# Patient Record
Sex: Female | Born: 1955 | Race: White | Hispanic: No | Marital: Married | Smoking: Never smoker
Health system: Southern US, Community
[De-identification: ages and names within clinical notes are randomized; demographics above are authoritative.]

## PROBLEM LIST (undated history)

## (undated) DIAGNOSIS — Z9221 Personal history of antineoplastic chemotherapy: Secondary | ICD-10-CM

## (undated) DIAGNOSIS — B029 Zoster without complications: Secondary | ICD-10-CM

## (undated) DIAGNOSIS — Z923 Personal history of irradiation: Secondary | ICD-10-CM

## (undated) DIAGNOSIS — C50211 Malignant neoplasm of upper-inner quadrant of right female breast: Secondary | ICD-10-CM

## (undated) DIAGNOSIS — C50919 Malignant neoplasm of unspecified site of unspecified female breast: Secondary | ICD-10-CM

## (undated) DIAGNOSIS — L570 Actinic keratosis: Secondary | ICD-10-CM

## (undated) HISTORY — DX: Malignant neoplasm of upper-inner quadrant of right female breast: C50.211

## (undated) HISTORY — DX: Actinic keratosis: L57.0

## (undated) HISTORY — DX: Zoster without complications: B02.9

---
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
## 2009-05-16 HISTORY — PX: COLONOSCOPY: SHX174

## 2011-11-09 ENCOUNTER — Ambulatory Visit (INDEPENDENT_AMBULATORY_CARE_PROVIDER_SITE_OTHER): Payer: BC Managed Care – PPO | Admitting: Internal Medicine

## 2011-11-09 ENCOUNTER — Encounter: Payer: Self-pay | Admitting: Internal Medicine

## 2011-11-09 ENCOUNTER — Other Ambulatory Visit (HOSPITAL_COMMUNITY)
Admission: RE | Admit: 2011-11-09 | Discharge: 2011-11-09 | Disposition: A | Discharge status: Home / Self Care | Payer: BC Managed Care – PPO | Source: Ambulatory Visit | Attending: Internal Medicine | Admitting: Internal Medicine

## 2011-11-09 VITALS — BP 106/64 | HR 70 | Temp 97.9°F | Resp 14 | Ht 65.0 in | Wt 141.0 lb

## 2011-11-09 DIAGNOSIS — Z01419 Encounter for gynecological examination (general) (routine) without abnormal findings: Secondary | ICD-10-CM | POA: Insufficient documentation

## 2011-11-09 DIAGNOSIS — E559 Vitamin D deficiency, unspecified: Secondary | ICD-10-CM

## 2011-11-09 DIAGNOSIS — Z1239 Encounter for other screening for malignant neoplasm of breast: Secondary | ICD-10-CM

## 2011-11-09 DIAGNOSIS — Z124 Encounter for screening for malignant neoplasm of cervix: Secondary | ICD-10-CM

## 2011-11-09 DIAGNOSIS — Z13220 Encounter for screening for lipoid disorders: Secondary | ICD-10-CM

## 2011-11-09 DIAGNOSIS — R5383 Other fatigue: Secondary | ICD-10-CM

## 2011-11-09 DIAGNOSIS — R5381 Other malaise: Secondary | ICD-10-CM

## 2011-11-09 DIAGNOSIS — B373 Candidiasis of vulva and vagina: Secondary | ICD-10-CM

## 2011-11-09 DIAGNOSIS — N760 Acute vaginitis: Secondary | ICD-10-CM | POA: Insufficient documentation

## 2011-11-09 DIAGNOSIS — Z1151 Encounter for screening for human papillomavirus (HPV): Secondary | ICD-10-CM | POA: Insufficient documentation

## 2011-11-09 NOTE — Progress Notes (Signed)
Patient ID: Karen Washington, female   DOB: 22-Oct-1955, 56 y.o.   MRN: [id number]  . There is no problem list on file for this patient.   Subjective:  CC:   Chief Complaint  Patient presents with  . New Patient    hasn't been seen in 2 years    HPI:   Karen Washington is a 56 y.o. female who presents  no significant PMH, presents today for her annual exam.  She has no specific complaints.    She sleeps an average of 7 to 9 hours per night,  Wakes up feeling refreshed.  Eats a healthy balanced diet.  Drinks 1 or  2 glasses of wine or other cocktail per night. She exercises 4 to 5 days per week with a combination of aerobic, stretching and light weights.  She is independent of all ADLs.  Does not use a walker or other assistive device.  No trouble climbing stairs. No chronic diarrhea or constipation.  Last annual was 2011. Occasional sleep disturbance.  menopause over 3 years ago  History reviewed. No pertinent past medical history.  History reviewed. No pertinent past surgical history.  History reviewed. No pertinent family history.  History   Social History  . Marital Status: Married    Spouse Name: N/A    Number of Children: N/A  . Years of Education: N/A   Occupational History  . Not on file.   Social History Main Topics  . Smoking status: Never Smoker   . Smokeless tobacco: Never Used  . Alcohol Use: Yes  . Drug Use: No  . Sexually Active: Not on file   Other Topics Concern  . Not on file   Social History Narrative  . No narrative on file        No Known Allergies  Review of Systems:   The remainder of the review of systems was negative except those addressed in the HPI.   Objective:  BP 106/64  Pulse 70  Temp 97.9 F (36.6 C) (Oral)  Resp 14  Ht 5\' 5"  (1.651 m)  Wt 141 lb (63.957 kg)  BMI 23.46 kg/m2  SpO2 97%  BP 106/64  Pulse 70  Temp 97.9 F (36.6 C) (Oral)  Resp 14  Ht 5\' 5"  (1.651 m)  Wt 141 lb (63.957 kg)  BMI 23.46 kg/m2  SpO2  97%  General Appearance:    Alert, cooperative, no distress, appears stated age  Head:    Normocephalic, without obvious abnormality, atraumatic  Eyes:    PERRL, conjunctiva/corneas clear, EOM's intact, fundi    benign, both eyes  Ears:    Normal TM's and external ear canals, both ears  Nose:   Nares normal, septum midline, mucosa normal, no drainage    or sinus tenderness  Throat:   Lips, mucosa, and tongue normal; teeth and gums normal  Neck:   Supple, symmetrical, trachea midline, no adenopathy;    thyroid:  no enlargement/tenderness/nodules; no carotid   bruit or JVD  Back:     Symmetric, no curvature, ROM normal, no CVA tenderness  Lungs:     Clear to auscultation bilaterally, respirations unlabored  Chest Wall:    No tenderness or deformity   Heart:    Regular rate and rhythm, S1 and S2 normal, no murmur, rub   or gallop  Breast Exam:    No tenderness, masses, or nipple abnormality  Abdomen:     Soft, non-tender, bowel sounds active all four quadrants,    no  masses, no organomegaly  Genitalia:    Normal female without lesion, discharge or tenderness  Rectal:    Normal tone, normal prostate, no masses or tenderness;   guaiac negative stool  Extremities:   Extremities normal, atraumatic, no cyanosis or edema  Pulses:   2+ and symmetric all extremities  Skin:   Skin color, texture, turgor normal, no rashes or lesions  Lymph nodes:   Cervical, supraclavicular, and axillary nodes normal  Neurologic:   CNII-XII intact, normal strength, sensation and reflexes    throughout      Assessment and Plan:  No problem-specific assessment & plan notes found for this encounter.   Updated Medication List Outpatient Encounter Prescriptions as of 11/09/2011  Medication Sig Dispense Refill  . calcium carbonate (OS-CAL) 600 MG TABS Take 600 mg by mouth 2 (two) times daily with a meal.      . cholecalciferol (VITAMIN D) 1000 UNITS tablet Take 1,000 Units by mouth daily.      . Multiple  Vitamin (MULTIVITAMIN) tablet Take 1 tablet by mouth daily.         Orders Placed This Encounter  Procedures  . MM Digital Screening  . HM MAMMOGRAPHY  . HM PAP SMEAR  . Comprehensive metabolic panel  . CBC with Differential  . Lipid panel  . TSH  . Vitamin D 25 hydroxy  . HM COLONOSCOPY    Return in about 1 year (around 11/08/2012).

## 2011-11-10 ENCOUNTER — Other Ambulatory Visit (INDEPENDENT_AMBULATORY_CARE_PROVIDER_SITE_OTHER): Payer: BC Managed Care – PPO | Admitting: *Deleted

## 2011-11-10 DIAGNOSIS — Z124 Encounter for screening for malignant neoplasm of cervix: Secondary | ICD-10-CM

## 2011-11-10 DIAGNOSIS — R5383 Other fatigue: Secondary | ICD-10-CM

## 2011-11-10 DIAGNOSIS — E559 Vitamin D deficiency, unspecified: Secondary | ICD-10-CM

## 2011-11-10 DIAGNOSIS — B373 Candidiasis of vulva and vagina: Secondary | ICD-10-CM

## 2011-11-10 DIAGNOSIS — Z1239 Encounter for other screening for malignant neoplasm of breast: Secondary | ICD-10-CM

## 2011-11-10 DIAGNOSIS — Z13220 Encounter for screening for lipoid disorders: Secondary | ICD-10-CM

## 2011-11-10 LAB — LIPID PANEL
HDL: 87.4 mg/dL (ref >39.00)
Total CHOL/HDL Ratio: 2
Triglycerides: 46 mg/dL (ref 0.0–149.0)

## 2011-11-10 LAB — CBC WITH DIFFERENTIAL/PLATELET
Basophils Absolute: 0 10*3/uL (ref 0.0–0.1)
Basophils Relative: 0.3 % (ref 0.0–3.0)
Eosinophils Absolute: 0.1 10*3/uL (ref 0.0–0.7)
Lymphocytes Relative: 16 % (ref 12.0–46.0)
MCHC: 33 g/dL (ref 30.0–36.0)
MCV: 95.7 fl (ref 78.0–100.0)
Monocytes Absolute: 0.5 10*3/uL (ref 0.1–1.0)
Neutrophils Relative %: 75.3 % (ref 43.0–77.0)
RBC: 4.52 Mil/uL (ref 3.87–5.11)
RDW: 12.8 % (ref 11.5–14.6)

## 2011-11-10 LAB — COMPREHENSIVE METABOLIC PANEL
ALT: 17 U/L (ref 0–35)
AST: 20 U/L (ref 0–37)
Albumin: 4.2 g/dL (ref 3.5–5.2)
Alkaline Phosphatase: 54 U/L (ref 39–117)
Chloride: 102 mEq/L (ref 96–112)
Potassium: 4.6 mEq/L (ref 3.5–5.1)
Sodium: 139 mEq/L (ref 135–145)
Total Protein: 6.9 g/dL (ref 6.0–8.3)

## 2011-11-10 LAB — TSH: TSH: 1.5 u[IU]/mL (ref 0.35–5.50)

## 2011-11-10 NOTE — Addendum Note (Signed)
Addended by: Baldomero Lamy on: 11/10/2011 09:58 AM   Modules accepted: Orders

## 2011-11-13 ENCOUNTER — Encounter: Payer: Self-pay | Admitting: Internal Medicine

## 2011-12-21 ENCOUNTER — Encounter: Payer: Self-pay | Admitting: Internal Medicine

## 2012-01-12 ENCOUNTER — Encounter: Payer: Self-pay | Admitting: Internal Medicine

## 2012-05-16 DIAGNOSIS — B029 Zoster without complications: Secondary | ICD-10-CM

## 2012-05-16 DIAGNOSIS — C50919 Malignant neoplasm of unspecified site of unspecified female breast: Secondary | ICD-10-CM

## 2012-05-16 HISTORY — PX: PORTACATH PLACEMENT: SHX2246

## 2012-05-16 HISTORY — DX: Zoster without complications: B02.9

## 2012-05-16 HISTORY — DX: Malignant neoplasm of unspecified site of unspecified female breast: C50.919

## 2012-05-16 HISTORY — PX: AUGMENTATION MAMMAPLASTY: SUR837

## 2012-11-13 DIAGNOSIS — C50211 Malignant neoplasm of upper-inner quadrant of right female breast: Secondary | ICD-10-CM

## 2012-11-13 HISTORY — DX: Malignant neoplasm of upper-inner quadrant of right female breast: C50.211

## 2012-11-19 ENCOUNTER — Encounter: Payer: Self-pay | Admitting: Adult Health

## 2012-11-19 ENCOUNTER — Ambulatory Visit (INDEPENDENT_AMBULATORY_CARE_PROVIDER_SITE_OTHER): Payer: BC Managed Care – PPO | Admitting: Adult Health

## 2012-11-19 VITALS — BP 120/72 | HR 72 | Temp 98.6°F | Resp 12 | Wt 140.5 lb

## 2012-11-19 DIAGNOSIS — R51 Headache: Secondary | ICD-10-CM

## 2012-11-19 DIAGNOSIS — R519 Headache, unspecified: Secondary | ICD-10-CM | POA: Insufficient documentation

## 2012-11-19 DIAGNOSIS — N6310 Unspecified lump in the right breast, unspecified quadrant: Secondary | ICD-10-CM | POA: Insufficient documentation

## 2012-11-19 DIAGNOSIS — N63 Unspecified lump in unspecified breast: Secondary | ICD-10-CM

## 2012-11-19 MED ORDER — VALACYCLOVIR HCL 1 G PO TABS: 1000.0000 mg | ORAL_TABLET | Freq: Two times a day (BID) | ORAL | Status: DC

## 2012-11-19 NOTE — Assessment & Plan Note (Addendum)
Right side head pain. Erythema and a maculopapular rash on right side of head, behind the ear and down towards the right occipital area. Burning, pain to touch and general malaise reported. Suspect early shingles vs. temporal arteritis. Check cbc, ESR, CRP, LFTs. Start valacyclovir 1000 mg bid x 7 days. Offered pain medication; however, patient would like to try OTC meds such as ibuprofen.

## 2012-11-19 NOTE — Progress Notes (Signed)
  Subjective:    Patient ID: Karen Washington, female    DOB: 1955/05/23, 57 y.o.   MRN: [id number]  HPI  Pt presents with right side head pain, tender to touch, but not headache, some neck tightness, fatigue. She has felt mild malaise today. Denies jaw pain or change in vision. Patient first noticed symptoms on Saturday. She reports a shooting pain into her ear however she specifies that it is not an ear ache. Pain radiates from the parietal area of her head down around her ear to the occipital area.  Also patient presents with feeling a lump on her right breast. She reports that it is also tender to touch. She has been working out with a Systems analyst and first noticed lump a couple of days ago. Her mammogram is due this month.  Review of Systems  Constitutional: Positive for fatigue. Negative for fever and chills.  HENT: Positive for neck stiffness.   Eyes: Negative for photophobia, pain, redness and visual disturbance.  Respiratory: Negative.   Cardiovascular: Negative.   Skin: Negative.   Neurological:       Pain and burning on right side of head, behind the ear and towards the posterior part of head  Psychiatric/Behavioral: Negative.   All other systems reviewed and are negative.  Breast: right breast tenderness with small lump at 12 o'clock.   BP 120/72  Pulse 72  Temp(Src) 98.6 F (37 C) (Oral)  Resp 12  Wt 140 lb 8 oz (63.73 kg)  BMI 23.38 kg/m2  SpO2 98%    Objective:   Physical Exam  Constitutional: She is oriented to person, place, and time. She appears well-developed and well-nourished. No distress.  HENT:  Head: Normocephalic and atraumatic.  Right Ear: External ear normal.  Left Ear: External ear normal.  Mouth/Throat: No oropharyngeal exudate.  Eyes: Conjunctivae and EOM are normal. Pupils are equal, round, and reactive to light.  Neck: Normal range of motion.  Cardiovascular: Normal rate and regular rhythm.   Pulmonary/Chest: Effort normal. No respiratory  distress. She has no wheezes.  Genitourinary: There is breast tenderness.  Lymphadenopathy:    She has no cervical adenopathy.  Neurological: She is alert and oriented to person, place, and time. No cranial nerve deficit. Coordination normal.  Skin:  erythema and a maculopapular rash on the right parietal area of head, behind the right ear and down towards the right occipital area. The area is painful to touch.   Psychiatric: She has a normal mood and affect. Her behavior is normal. Judgment and thought content normal.  Breast: right breast with small nodule at 12 o'clock. Painful to touch.     Assessment & Plan:

## 2012-11-19 NOTE — Assessment & Plan Note (Signed)
Small right breast nodule at 12 o'clock. Send for diagnostic mammogram.

## 2012-11-20 ENCOUNTER — Telehealth: Payer: Self-pay | Admitting: Internal Medicine

## 2012-11-20 ENCOUNTER — Other Ambulatory Visit: Payer: Self-pay | Admitting: Adult Health

## 2012-11-20 DIAGNOSIS — N6310 Unspecified lump in the right breast, unspecified quadrant: Secondary | ICD-10-CM

## 2012-11-20 LAB — C-REACTIVE PROTEIN: CRP: <0.5 mg/dL (ref 0.5–20.0)

## 2012-11-20 LAB — CBC WITH DIFFERENTIAL/PLATELET
Basophils Absolute: 0 10*3/uL (ref 0.0–0.1)
Eosinophils Relative: 2.3 % (ref 0.0–5.0)
HCT: 43.8 % (ref 36.0–46.0)
Hemoglobin: 14.6 g/dL (ref 12.0–15.0)
Lymphs Abs: 1.3 10*3/uL (ref 0.7–4.0)
Monocytes Relative: 11.7 % (ref 3.0–12.0)
Neutro Abs: 3.8 10*3/uL (ref 1.4–7.7)
RDW: 12.8 % (ref 11.5–14.6)

## 2012-11-20 LAB — HEPATIC FUNCTION PANEL
ALT: 16 U/L (ref 0–35)
AST: 19 U/L (ref 0–37)
Albumin: 4.2 g/dL (ref 3.5–5.2)
Total Protein: 6.8 g/dL (ref 6.0–8.3)

## 2012-11-20 LAB — SEDIMENTATION RATE: Sed Rate: 9 mm/hr (ref 0–22)

## 2012-11-20 NOTE — Telephone Encounter (Signed)
Your patient 

## 2012-11-20 NOTE — Telephone Encounter (Signed)
Patient needing a referral to Dr. Sheliah Hatch for a needle biopsy on her right breast.

## 2012-11-21 NOTE — Telephone Encounter (Signed)
Pt notified referral in progress.

## 2012-11-22 ENCOUNTER — Encounter: Payer: Self-pay | Admitting: General Surgery

## 2012-11-22 ENCOUNTER — Ambulatory Visit (INDEPENDENT_AMBULATORY_CARE_PROVIDER_SITE_OTHER): Payer: BC Managed Care – PPO | Admitting: General Surgery

## 2012-11-22 VITALS — BP 116/78 | HR 78 | Resp 14 | Ht 65.0 in

## 2012-11-22 DIAGNOSIS — C50211 Malignant neoplasm of upper-inner quadrant of right female breast: Secondary | ICD-10-CM | POA: Insufficient documentation

## 2012-11-22 DIAGNOSIS — N63 Unspecified lump in unspecified breast: Secondary | ICD-10-CM | POA: Insufficient documentation

## 2012-11-22 DIAGNOSIS — Z853 Personal history of malignant neoplasm of breast: Secondary | ICD-10-CM | POA: Insufficient documentation

## 2012-11-22 HISTORY — PX: BREAST BIOPSY: SHX20

## 2012-11-22 NOTE — Progress Notes (Signed)
Patient ID: Karen Washington, female   DOB: 22-Sep-1955, 57 y.o.   MRN: [id number]  Chief Complaint  Patient presents with  . Breast Problem    category 4 mammogram    HPI Karen Washington is a 57 y.o. female who presents for a breast evaluation. The most recent mammogram was done on 11/20/12 at [facility] with a birad category 4. The patient states she noticed a knot approximately 2-3 weeks ago in the right upper inner quadrant of the right breast. She does have tenderness in that area. She does self breast check every other month and does get regular mammograms done.  The patient had had a strenuous week with the personal trainer, swam for exercise for the first time in one year and then was doing aerobic workout when she noticed soreness in both chest wall muscles. The left chest wall discomfort resolved, and palpation of the right side she became aware of a lump about 3 fingerbreadths below the clavicle.  HPI  Past Medical History  Diagnosis Date  . Shingles 2014    History reviewed. No pertinent past surgical history.  Family History  Problem Relation Age of Onset  . Cancer Mother     uterine   . Cancer Maternal Grandmother     breast    Social History History  Substance Use Topics  . Smoking status: Never Smoker   . Smokeless tobacco: Never Used  . Alcohol Use: Yes    No Known Allergies  Current Outpatient Prescriptions  Medication Sig Dispense Refill  . calcium carbonate (OS-CAL) 600 MG TABS Take 600 mg by mouth 2 (two) times daily with a meal.      . cholecalciferol (VITAMIN D) 1000 UNITS tablet Take 1,000 Units by mouth daily.      Marland Kitchen ibuprofen (ADVIL,MOTRIN) 200 MG tablet Take 200 mg by mouth every 6 (six) hours as needed for pain.      . Multiple Vitamin (MULTIVITAMIN) tablet Take 1 tablet by mouth daily.      . valACYclovir (VALTREX) 1000 MG tablet Take 1 tablet (1,000 mg total) by mouth 2 (two) times daily.  14 tablet  0   No current facility-administered medications for this  visit.    Review of Systems Review of Systems  Blood pressure 116/78, pulse 78, resp. rate 14, height 5\' 5"  (1.651 m).  Physical Exam Physical Exam  Constitutional: She is oriented to person, place, and time. She appears well-developed and well-nourished.  Neck: No thyromegaly present.  Cardiovascular: Normal rate, regular rhythm and normal heart sounds.   No murmur heard. Pulmonary/Chest: Effort normal and breath sounds normal. Right breast exhibits no inverted nipple, no mass, no nipple discharge, no skin change and no tenderness. Left breast exhibits no inverted nipple, no mass, no nipple discharge, no skin change and no tenderness. Breasts are symmetrical.  Little thickening in the left upper outer quadrant of left breast.   Thickening in the right upper outer quadrant of right breast.   1 o'clock thickening 12 cm from the nipple 2 cm area of right breast.   Lymphadenopathy:    She has no cervical adenopathy.  Neurological: She is alert and oriented to person, place, and time.  Skin: Skin is warm and dry.    Data Reviewed October 21, 2012 mammograms and ultrasound of the right breast completed at [facility] were reviewed. Ultrasound shows a ill-defined bilobed mass in the upper quadrant.  Ultrasound examination of the right breast 12 cm from the nipple at  the 1:00 position identified a multilobed hypoechoic mass extending from the pectoralis fashion to within 5 mm of the skin. The areas measured 0.53 x 0.57 x 0.85 and 0.68 x 0.71 x 0.80 but in aggregate measured 1.28 x 1.80 transversely. No significant flow was identified a vascular imaging. The patient was amenable to biopsy.  10 cc of 0.5% Xylocaine with 0.25% Marcaine with one 200,000 of epinephrine was utilized well-tolerated. A 10-gauge Encor device was advanced under ultrasound guidance and a significant portion of both lobes of the mass were removed. Some discomfort was appreciated on the superficial bites. A postbiopsy clip  was placed. Initially there was a hematoma cavity developing. This was rinsed with 10 cc of 1% plain Xylocaine with one 100,000 of epinephrine and direct pressure held for 10 minutes. Repeat ultrasound showed no hematoma and the skin defect was closed with benzoin and Steri-Strips followed by Telfa and Tegaderm dressing.  Assessment    Right breast mass, no evidence of underlying muscle hematoma.     Plan    Instructions were provided for postoperative wound care. She'll be contacted with pathology is available.        Earline Mayotte 11/23/2012, 6:39 PM

## 2012-11-22 NOTE — Patient Instructions (Addendum)

## 2012-11-23 ENCOUNTER — Encounter: Payer: Self-pay | Admitting: General Surgery

## 2012-11-23 ENCOUNTER — Telehealth: Payer: Self-pay | Admitting: General Surgery

## 2012-11-23 DIAGNOSIS — C50211 Malignant neoplasm of upper-inner quadrant of right female breast: Secondary | ICD-10-CM

## 2012-11-23 NOTE — Telephone Encounter (Signed)
The patient was notified that the right breast biopsy completed November 22, 2012 showed evidence of invasive cancer.  Arrangements are placed for sitdown review on Monday, November 26, 2012 at 8 AM.

## 2012-11-26 ENCOUNTER — Ambulatory Visit (INDEPENDENT_AMBULATORY_CARE_PROVIDER_SITE_OTHER): Payer: BC Managed Care – PPO | Admitting: General Surgery

## 2012-11-26 ENCOUNTER — Ambulatory Visit: Payer: Self-pay | Admitting: General Surgery

## 2012-11-26 ENCOUNTER — Encounter: Payer: Self-pay | Admitting: General Surgery

## 2012-11-26 VITALS — BP 130/78 | HR 84 | Resp 14 | Ht 65.0 in | Wt 137.0 lb

## 2012-11-26 DIAGNOSIS — C50919 Malignant neoplasm of unspecified site of unspecified female breast: Secondary | ICD-10-CM

## 2012-11-26 NOTE — Patient Instructions (Signed)
Patient to have labs and a chest x-ray. 

## 2012-11-26 NOTE — Progress Notes (Signed)
Patient ID: Karen Washington, female   DOB: Jul 19, 1955, 57 y.o.   MRN: [id number]   The patient is seen this morning accompanied by her husband to review the results were recently completed right breast biopsy. This showed evidence of invasive mammary cancer. She been notified of the results by phone on weekend. She reports minimal discomfort at the biopsy site.  Examination of the right pressures minimal bruising. No hematoma formation.  We spent the majority of our 30-40 minutes reviewing options for management. Mastectomy and lumpectomy/radiation therapy were present it is equivalent procedures. Indication for sentinel node biopsy was discussed. Opportunity for second opinion locally or at any of the local universities was reviewed.  An informational brochure was provided. The patient will contact the office if she has any questions, and will be kept up-to-date as her laboratory results become available.  Patient to have the following labs drawn: Met B, CEA, and CA 27-29. This patient will also have a chest x-ray PA and lateral at [facility].

## 2012-11-27 ENCOUNTER — Encounter: Payer: Self-pay | Admitting: General Surgery

## 2012-11-28 LAB — BASIC METABOLIC PANEL
BUN/Creatinine Ratio: 17 (ref 9–23)
CO2: 26 mmol/L (ref 18–29)
Chloride: 99 mmol/L (ref 97–108)
GFR calc Af Amer: 73 mL/min/{1.73_m2} (ref >59)
Sodium: 141 mmol/L (ref 134–144)

## 2012-11-28 LAB — CANCER ANTIGEN 27.29: CA 27.29: 18.2 U/mL (ref 0.0–38.6)

## 2012-11-29 ENCOUNTER — Telehealth: Payer: Self-pay | Admitting: *Deleted

## 2012-11-29 ENCOUNTER — Ambulatory Visit: Payer: BC Managed Care – PPO

## 2012-11-29 MED ORDER — VALACYCLOVIR HCL 1 G PO TABS: 1000.0000 mg | ORAL_TABLET | Freq: Two times a day (BID) | ORAL | Status: DC

## 2012-11-29 NOTE — Telephone Encounter (Signed)
Saw Karen Rey NP last Monday Diagnosed with shingles finished ABX and one day off ABX and symptoms returned by today, patient would like to know if she needs more meds for shingles or another appointment , please advise?

## 2012-11-29 NOTE — Telephone Encounter (Signed)
Pt called again, wanting to know if she could take more Valacyclovir. Completed the 7 days and states recurrence of blisters, stated pain is tolerable. Dr. Darrick Huntsman notified and advised to refill Valacyclovir and for pt to be seen in office tomorrow to evaluate. Pt verbalized understanding, appt scheduled for tomorrow and Rx sent to pharmacy by escript.

## 2012-11-30 ENCOUNTER — Ambulatory Visit (INDEPENDENT_AMBULATORY_CARE_PROVIDER_SITE_OTHER): Payer: BC Managed Care – PPO | Admitting: Internal Medicine

## 2012-11-30 ENCOUNTER — Encounter: Payer: Self-pay | Admitting: Internal Medicine

## 2012-11-30 VITALS — BP 110/88 | HR 74 | Temp 98.2°F | Resp 14 | Wt 136.8 lb

## 2012-11-30 DIAGNOSIS — N63 Unspecified lump in unspecified breast: Secondary | ICD-10-CM

## 2012-11-30 DIAGNOSIS — N6310 Unspecified lump in the right breast, unspecified quadrant: Secondary | ICD-10-CM

## 2012-11-30 DIAGNOSIS — B029 Zoster without complications: Secondary | ICD-10-CM

## 2012-11-30 LAB — PATHOLOGY

## 2012-11-30 MED ORDER — SULFAMETHOXAZOLE-TRIMETHOPRIM 800-160 MG PO TABS: 1.0000 | ORAL_TABLET | Freq: Two times a day (BID) | ORAL | Status: DC

## 2012-11-30 MED ORDER — ALPRAZOLAM 0.25 MG PO TABS: 0.2500 mg | ORAL_TABLET | Freq: Two times a day (BID) | ORAL | Status: DC | PRN

## 2012-11-30 NOTE — Patient Instructions (Addendum)
Start the antibiotic (Septra) if you develop sin irritation and redness around the blisters  The alprazolam is very mild and can be taken at 2 am for insomnia

## 2012-11-30 NOTE — Assessment & Plan Note (Signed)
Lumpectomy planned for July 31

## 2012-12-02 ENCOUNTER — Encounter: Payer: Self-pay | Admitting: Internal Medicine

## 2012-12-02 DIAGNOSIS — B029 Zoster without complications: Secondary | ICD-10-CM | POA: Insufficient documentation

## 2012-12-02 NOTE — Assessment & Plan Note (Signed)
Recurrent on right side of scalp.  Recurrence is likely, secondary to severe emotional stress.  Will repeat one week of acyclovir.  I have given her an rx for Septra DS given that she will be out of town in the event that she fevelops secondary bacterial infection

## 2012-12-02 NOTE — Progress Notes (Signed)
Patient ID: Karen Washington, female   DOB: 1956/04/22, 57 y.o.   MRN: [id number]   Patient Active Problem List   Diagnosis Date Noted  . Shingles outbreak 12/02/2012  . Lump or mass in breast 11/22/2012  . Breast cancer of upper-inner quadrant of right female breast 11/22/2012  . Head pain 11/19/2012  . Lump of right breast 11/19/2012    Subjective:  CC:   Chief Complaint  Patient presents with  . Acute Visit    shingles    HPI:   Karen Washington a 57 y.o. female who presents Recurrent blistering lesions on occipital scalp. Patient was treated for shingles on July 7 by Raquel when she presented with a painful maculopapular rash from the parietal to occipital  Scalp.  After a week of acyclovir she noted resolution of lesions.  However, she noted recurrence of lesions two days ago.  She has been under considerable stress since the discovery of a breast mass self discovered which was biopsied on July 10th and positive for invasive CA .   She is scheduled for lumpectomy next week and is anxious to get it over with. She is gong to the beach for a week to try to relax prior to the procedure.  No fevers, headaches,  Vision changes.     Past Medical History  Diagnosis Date  . Shingles 2014    History reviewed. No pertinent past surgical history.     The following portions of the patient's history were reviewed and updated as appropriate: Allergies, current medications, and problem list.    Review of Systems:   12 Pt  review of systems was negative except those addressed in the HPI,     History   Social History  . Marital Status: Married    Spouse Name: N/A    Number of Children: N/A  . Years of Education: N/A   Occupational History  . Not on file.   Social History Main Topics  . Smoking status: Never Smoker   . Smokeless tobacco: Never Used  . Alcohol Use: Yes  . Drug Use: No  . Sexually Active: Not on file   Other Topics Concern  . Not on file   Social History  Narrative  . No narrative on file    Objective:  BP 110/88  Pulse 74  Temp(Src) 98.2 F (36.8 C) (Oral)  Resp 14  Wt 136 lb 12 oz (62.029 kg)  BMI 22.76 kg/m2  SpO2 99%  General appearance: alert, cooperative and appears stated age Ears: normal TM's and external ear canals both ears Throat: lips, mucosa, and tongue normal; teeth and gums normal Neck: no adenopathy, no carotid bruit, supple, symmetrical, trachea midline and thyroid not enlarged, symmetric, no tenderness/mass/nodules Back: symmetric, no curvature. ROM normal. No CVA tenderness. Lungs: clear to auscultation bilaterally Heart: regular rate and rhythm, S1, S2 normal, no murmur, click, rub or gallop Abdomen: soft, non-tender; bowel sounds normal; no masses,  no organomegaly Pulses: 2+ and symmetric Skin: papular rash on posterior neck. Healing lesions on scalp. Lymph nodes: Cervical, supraclavicular, and axillary nodes normal.  Assessment and Plan:  Lump of right breast Lumpectomy planned for July 31  Shingles outbreak Recurrent on right side of scalp.  Recurrence is likely, secondary to severe emotional stress.  Will repeat one week of acyclovir.  I have given her an rx for Septra DS given that she will be out of town in the event that she fevelops secondary bacterial infection  Updated Medication List Outpatient Encounter Prescriptions as of 11/30/2012  Medication Sig Dispense Refill  . calcium carbonate (OS-CAL) 600 MG TABS Take 600 mg by mouth 2 (two) times daily with a meal.      . cholecalciferol (VITAMIN D) 1000 UNITS tablet Take 1,000 Units by mouth daily.      . Multiple Vitamin (MULTIVITAMIN) tablet Take 1 tablet by mouth daily.      . valACYclovir (VALTREX) 1000 MG tablet Take 1 tablet (1,000 mg total) by mouth 2 (two) times daily.  14 tablet  0  . ALPRAZolam (XANAX) 0.25 MG tablet Take 1 tablet (0.25 mg total) by mouth 2 (two) times daily as needed for sleep or anxiety.  20 tablet  0  . ibuprofen  (ADVIL,MOTRIN) 200 MG tablet Take 200 mg by mouth every 6 (six) hours as needed for pain.      Marland Kitchen sulfamethoxazole-trimethoprim (SEPTRA DS) 800-160 MG per tablet Take 1 tablet by mouth 2 (two) times daily.  14 tablet  0   No facility-administered encounter medications on file as of 11/30/2012.     No orders of the defined types were placed in this encounter.    No Follow-up on file.

## 2012-12-04 ENCOUNTER — Telehealth: Payer: Self-pay | Admitting: General Surgery

## 2012-12-04 ENCOUNTER — Telehealth: Payer: Self-pay | Admitting: *Deleted

## 2012-12-04 NOTE — Telephone Encounter (Signed)
Cancelled call

## 2012-12-04 NOTE — Telephone Encounter (Signed)
Message copied by Nicholes Mango on Tue Dec 04, 2012  3:16 PM ------      Message from: Randleman, Merrily Pew      Created: Tue Dec 04, 2012  2:17 PM       Proceed with planned dates for surgery.      ----- Message -----         From: Wendall Stade, CMA         Sent: 11/29/2012   1:36 PM           To: Earline Mayotte, MD            Since patient has the shingles will we have to delay surgery? We are looking at doing surgery 12-13-12. Patient started having symptoms on 11-16-12 and was diagnosed on 11-19-12. She was on medication for this for one week and finished on 11-26-12. Patient states symptoms started coming back once she finished meds and has a call into Dr. Darrick Huntsman. Please advise. Thanks.       ------

## 2012-12-04 NOTE — Telephone Encounter (Signed)
Patient has been scheduled for surgery on 12-13-12 at [facility]. Paperwork has been mailed to the patient. She will call if she has further questions.

## 2012-12-05 ENCOUNTER — Other Ambulatory Visit: Payer: Self-pay | Admitting: General Surgery

## 2012-12-05 DIAGNOSIS — C50211 Malignant neoplasm of upper-inner quadrant of right female breast: Secondary | ICD-10-CM

## 2012-12-07 ENCOUNTER — Telehealth: Payer: Self-pay | Admitting: *Deleted

## 2012-12-07 ENCOUNTER — Encounter: Payer: Self-pay | Admitting: General Surgery

## 2012-12-07 NOTE — Telephone Encounter (Signed)
Patient was contacted today to let her know that the arrival time to be at [facility] the day of surgery has been changed from 7:30 am to 10:15 am. She verbalizes understanding.

## 2012-12-13 ENCOUNTER — Ambulatory Visit: Payer: Self-pay | Admitting: General Surgery

## 2012-12-13 DIAGNOSIS — C50219 Malignant neoplasm of upper-inner quadrant of unspecified female breast: Secondary | ICD-10-CM

## 2012-12-13 HISTORY — PX: BREAST SURGERY: SHX581

## 2012-12-13 HISTORY — PX: BREAST LUMPECTOMY: SHX2

## 2012-12-14 ENCOUNTER — Encounter: Payer: Self-pay | Admitting: General Surgery

## 2012-12-14 LAB — PATHOLOGY REPORT

## 2012-12-15 ENCOUNTER — Telehealth: Payer: Self-pay | Admitting: General Surgery

## 2012-12-15 NOTE — Telephone Encounter (Signed)
Patient contacted with pathology results. Margins clear. Sentinel nodes x4 negative.  ER/PR-negative tumor. HER-2/neu was amplified.  This puts her at slightly higher risk for recurrent disease and formal medical oncology evaluation will be appropriate.  She reports that the Ace wrap bandage has been sliding and she'll discontinue this and replaced with a sports bra. She may initiate showers tomorrow. Followup the week of August 4 as previously scheduled.

## 2012-12-17 ENCOUNTER — Encounter: Payer: Self-pay | Admitting: General Surgery

## 2012-12-17 ENCOUNTER — Ambulatory Visit (INDEPENDENT_AMBULATORY_CARE_PROVIDER_SITE_OTHER): Payer: BC Managed Care – PPO | Admitting: General Surgery

## 2012-12-17 VITALS — BP 110/70 | HR 72 | Resp 12 | Ht 65.0 in | Wt 139.0 lb

## 2012-12-17 DIAGNOSIS — C50211 Malignant neoplasm of upper-inner quadrant of right female breast: Secondary | ICD-10-CM

## 2012-12-17 DIAGNOSIS — C50219 Malignant neoplasm of upper-inner quadrant of unspecified female breast: Secondary | ICD-10-CM

## 2012-12-17 NOTE — Patient Instructions (Addendum)
Patient to wear bra day and night.   This patient has been scheduled for an appointment with Dr. Sherrlyn Hock and Dr. Rushie Chestnut for 12-18-12 at 9 am. She is aware of date, time, and instructions.

## 2012-12-17 NOTE — Progress Notes (Signed)
Patient ID: Karen Washington, female   DOB: 1955-07-28, 57 y.o.   MRN: [id number]  Chief Complaint  Patient presents with  . Routine Post Op    right lumpectomy    HPI Karen Washington is a 57 y.o. female here today for her post op right lumpectomy done on 12/13/12. Patient states she is doing well and not having any pain. The patient had been contacted on August 2 regarding her pathology results. She is accompanied today by her husband was present for the interview and exam. HPI  Past Medical History  Diagnosis Date  . Shingles 2014    Past Surgical History  Procedure Laterality Date  . Breast surgery Right 2014    lumpectomy    Family History  Problem Relation Age of Onset  . Cancer Mother     uterine   . Cancer Maternal Grandmother     breast    Social History History  Substance Use Topics  . Smoking status: Never Smoker   . Smokeless tobacco: Never Used  . Alcohol Use: Yes    No Known Allergies  Current Outpatient Prescriptions  Medication Sig Dispense Refill  . ALPRAZolam (XANAX) 0.25 MG tablet Take 1 tablet (0.25 mg total) by mouth 2 (two) times daily as needed for sleep or anxiety.  20 tablet  0  . calcium carbonate (OS-CAL) 600 MG TABS Take 600 mg by mouth 2 (two) times daily with a meal.      . cholecalciferol (VITAMIN D) 1000 UNITS tablet Take 1,000 Units by mouth daily.      Marland Kitchen HYDROcodone-acetaminophen (NORCO/VICODIN) 5-325 MG per tablet Take 1 tablet by mouth as needed.      Marland Kitchen ibuprofen (ADVIL,MOTRIN) 200 MG tablet Take 200 mg by mouth every 6 (six) hours as needed for pain.      . Multiple Vitamin (MULTIVITAMIN) tablet Take 1 tablet by mouth daily.      Marland Kitchen sulfamethoxazole-trimethoprim (SEPTRA DS) 800-160 MG per tablet Take 1 tablet by mouth 2 (two) times daily.  14 tablet  0  . valACYclovir (VALTREX) 1000 MG tablet Take 1 tablet (1,000 mg total) by mouth 2 (two) times daily.  14 tablet  0   No current facility-administered medications for this visit.    Review  of Systems Review of Systems  Constitutional: Negative.   Respiratory: Negative.   Cardiovascular: Negative.     Blood pressure 110/70, pulse 72, resp. rate 12, height 5\' 5"  (1.651 m), weight 139 lb (63.05 kg).  Physical Exam Physical Exam  Constitutional: She is oriented to person, place, and time. She appears well-developed and well-nourished.  Pulmonary/Chest:  Right lumpectomy site healing well.  The right breast is situated in a normal position with good alignment of the nipple/areolar complex. Moderate ptosis of the left breast appreciated.  The shoulder range of motion.  Neurological: She is oriented to person, place, and time.  Skin: Skin is warm and dry.    Data Reviewed Pathology results reviewed. 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 Adjuvant online program suggested 12% absolute benefit from chemotherapy. No benefit from antiestrogen therapy.  Assessment    Doing well status post wide excision mastoplasty.    Plan    The patient is amenable to evaluation with medical and radiation oncology.    This patient has been scheduled for an appointment with Dr. Sherrlyn Washington and Dr. Rushie Washington for 12-18-12 at 9 am. She is aware of date, time, and instructions.  Karen Washington 12/17/2012, 7:14 PM

## 2012-12-18 ENCOUNTER — Ambulatory Visit: Payer: Self-pay | Admitting: Internal Medicine

## 2012-12-18 ENCOUNTER — Telehealth: Payer: Self-pay | Admitting: *Deleted

## 2012-12-18 ENCOUNTER — Other Ambulatory Visit: Payer: Self-pay | Admitting: General Surgery

## 2012-12-18 DIAGNOSIS — C50211 Malignant neoplasm of upper-inner quadrant of right female breast: Secondary | ICD-10-CM

## 2012-12-18 NOTE — Telephone Encounter (Signed)
Patient's surgery has been scheduled for 01-02-13 at [facility]. We will review instructions with patient at pre-op visit.   Tia at the [facility] was also informed of surgery plans.

## 2012-12-18 NOTE — Telephone Encounter (Signed)
Message copied by Nicholes Mango on Tue Dec 18, 2012  2:06 PM ------      Message from: Earline Mayotte      Created: Tue Dec 18, 2012 12:36 PM       Schedule for the week of Aug 18th.  Patient should be aware of program. Will review at OV on Aug 13th.       ----- Message -----         From: Wendall Stade, CMA         Sent: 12/18/2012  10:31 AM           To: Earline Mayotte, MD            Do you need to talk to patient re: port placement or can I go ahead and arrange surgery? See info on your desk. Thanks.       ------

## 2012-12-25 LAB — HER-2 / NEU, FISH: Avg Num CEP17 probes/nucleus:: 1.8

## 2012-12-25 LAB — ER/PR,IMMUNOHISTOCHEM,PARAFFIN
Estrogen Receptor IHC: 0 %
Progesterone Recp IP: 0 %

## 2012-12-26 ENCOUNTER — Encounter: Payer: Self-pay | Admitting: General Surgery

## 2012-12-26 ENCOUNTER — Ambulatory Visit (INDEPENDENT_AMBULATORY_CARE_PROVIDER_SITE_OTHER): Payer: BC Managed Care – PPO | Admitting: General Surgery

## 2012-12-26 VITALS — BP 108/72 | HR 72 | Resp 12 | Ht 65.0 in | Wt 134.0 lb

## 2012-12-26 DIAGNOSIS — C50211 Malignant neoplasm of upper-inner quadrant of right female breast: Secondary | ICD-10-CM

## 2012-12-26 DIAGNOSIS — C50219 Malignant neoplasm of upper-inner quadrant of unspecified female breast: Secondary | ICD-10-CM

## 2012-12-26 NOTE — Patient Instructions (Addendum)
Continue self breast exams. Call office for any new breast issues or concerns. Port placement scheduled Discussed resuming activities gradually.   Discussed port placement risk and benefits. May use scar fade or mederma as desired

## 2012-12-26 NOTE — Progress Notes (Signed)
Patient ID: Karen Washington, female   DOB: 03/16/1956, 57 y.o.   MRN: [id number]  Chief Complaint  Patient presents with  . Follow-up    HPI Karen Washington is a 57 y.o. female.  Patient here today for follow up right breast lumpectomy December 13 2012.  States she is doing well.  Starts chemotherapy Sept 2 2014.  Power port placement scheduled for 01/02/2013. HPI  Past Medical History  Diagnosis Date  . Shingles 2014    Past Surgical History  Procedure Laterality Date  . Breast surgery Right December 13 2012    lumpectomy    Family History  Problem Relation Age of Onset  . Cancer Mother     uterine   . Cancer Maternal Grandmother     breast    Social History History  Substance Use Topics  . Smoking status: Never Smoker   . Smokeless tobacco: Never Used  . Alcohol Use: Yes    No Known Allergies  Current Outpatient Prescriptions  Medication Sig Dispense Refill  . ALPRAZolam (XANAX) 0.25 MG tablet Take 1 tablet (0.25 mg total) by mouth 2 (two) times daily as needed for sleep or anxiety.  20 tablet  0  . calcium carbonate (OS-CAL) 600 MG TABS Take 600 mg by mouth 2 (two) times daily with a meal.      . cholecalciferol (VITAMIN D) 1000 UNITS tablet Take 1,000 Units by mouth daily.      Marland Kitchen ibuprofen (ADVIL,MOTRIN) 200 MG tablet Take 200 mg by mouth every 6 (six) hours as needed for pain.      . Multiple Vitamin (MULTIVITAMIN) tablet Take 1 tablet by mouth daily.       No current facility-administered medications for this visit.    Review of Systems Review of Systems  Constitutional: Negative.   Respiratory: Negative.   Cardiovascular: Negative.     Blood pressure 108/72, pulse 72, resp. rate 12, height 5\' 5"  (1.651 m), weight 134 lb (60.782 kg).  Physical Exam Physical Exam  Constitutional: She is oriented to person, place, and time. She appears well-developed and well-nourished.  Cardiovascular: Normal rate and regular rhythm.   Pulmonary/Chest: Effort normal and breath  sounds normal.  Neurological: She is alert and oriented to person, place, and time.  Skin: Skin is warm and dry.   The right breast volume is well preserved and the nipple areolar complex was well centered. There is moderate ptosis involving the left breast. Incision well healed right breast lumpectomy site.  Data Reviewed No data.  Assessment    Right breast cancer, doing well status post wide excision, mastoplasty and sentinel node biopsy.    Plan    The indication for power port placement as well as risks were reviewed.   The patient raised questionable in her left breast could be corrected to match the right. I recommended that this be postponed until one year after radiation therapy to minimize significant size disparity.       Earline Mayotte 12/26/2012, 1:17 PM

## 2013-01-02 ENCOUNTER — Ambulatory Visit: Payer: Self-pay | Admitting: General Surgery

## 2013-01-02 DIAGNOSIS — C50219 Malignant neoplasm of upper-inner quadrant of unspecified female breast: Secondary | ICD-10-CM

## 2013-01-04 ENCOUNTER — Encounter: Payer: Self-pay | Admitting: General Surgery

## 2013-01-14 ENCOUNTER — Ambulatory Visit: Payer: Self-pay | Admitting: Internal Medicine

## 2013-01-15 LAB — COMPREHENSIVE METABOLIC PANEL
Albumin: 4.3 g/dL (ref 3.4–5.0)
Calcium, Total: 9.5 mg/dL (ref 8.5–10.1)
Chloride: 104 mmol/L (ref 98–107)
Co2: 28 mmol/L (ref 21–32)
Creatinine: 1.08 mg/dL (ref 0.60–1.30)
EGFR (Non-African Amer.): 58 — ABNORMAL LOW
Glucose: 130 mg/dL — ABNORMAL HIGH (ref 65–99)
Osmolality: 280 (ref 275–301)
Potassium: 4 mmol/L (ref 3.5–5.1)
SGPT (ALT): 27 U/L (ref 12–78)
Total Protein: 7.7 g/dL (ref 6.4–8.2)

## 2013-01-15 LAB — CBC CANCER CENTER
Basophil %: 0.2 %
Eosinophil #: 0 x10 3/mm (ref 0.0–0.7)
Eosinophil %: 0 %
HCT: 42 % (ref 35.0–47.0)
Lymphocyte #: 1.2 x10 3/mm (ref 1.0–3.6)
Lymphocyte %: 9.5 %
MCH: 31.9 pg (ref 26.0–34.0)
MCHC: 34.1 g/dL (ref 32.0–36.0)
Monocyte %: 6.7 %
Neutrophil #: 10.4 x10 3/mm — ABNORMAL HIGH (ref 1.4–6.5)
Neutrophil %: 83.6 %
Platelet: 253 x10 3/mm (ref 150–440)
RDW: 13.1 % (ref 11.5–14.5)
WBC: 12.4 x10 3/mm — ABNORMAL HIGH (ref 3.6–11.0)

## 2013-01-16 LAB — CANCER ANTIGEN 27.29: CA 27.29: 9.9 U/mL (ref 0.0–38.6)

## 2013-01-22 LAB — CBC CANCER CENTER
Bands: 20 %
Eosinophil: 1 %
HCT: 42.7 % (ref 35.0–47.0)
MCH: 31.7 pg (ref 26.0–34.0)
MCV: 94 fL (ref 80–100)
Monocytes: 17 %
Myelocyte: 1 %
Platelet: 173 x10 3/mm (ref 150–440)
Promyelocyte: 1 %
RDW: 12.8 % (ref 11.5–14.5)
Segmented Neutrophils: 34 %
WBC: 16.9 x10 3/mm — ABNORMAL HIGH (ref 3.6–11.0)

## 2013-01-28 LAB — CBC CANCER CENTER
Basophil #: 0.2 x10 3/mm — ABNORMAL HIGH (ref 0.0–0.1)
Basophil %: 1 %
Eosinophil %: 0.1 %
HCT: 38.9 % (ref 35.0–47.0)
Lymphocyte #: 2.2 x10 3/mm (ref 1.0–3.6)
MCH: 32 pg (ref 26.0–34.0)
MCHC: 34 g/dL (ref 32.0–36.0)
Monocyte %: 5.7 %
Neutrophil %: 79.2 %
Platelet: 184 x10 3/mm (ref 150–440)
RDW: 13.2 % (ref 11.5–14.5)
WBC: 16 x10 3/mm — ABNORMAL HIGH (ref 3.6–11.0)

## 2013-02-03 DIAGNOSIS — Z9221 Personal history of antineoplastic chemotherapy: Secondary | ICD-10-CM

## 2013-02-03 HISTORY — DX: Personal history of antineoplastic chemotherapy: Z92.21

## 2013-02-04 ENCOUNTER — Encounter: Payer: BC Managed Care – PPO | Admitting: Internal Medicine

## 2013-02-04 LAB — HEPATIC FUNCTION PANEL A (ARMC)
Albumin: 4.1 g/dL (ref 3.4–5.0)
Alkaline Phosphatase: 96 U/L (ref 50–136)
Bilirubin, Direct: 0.1 mg/dL (ref 0.00–0.20)
SGOT(AST): 19 U/L (ref 15–37)

## 2013-02-04 LAB — CBC CANCER CENTER
Eosinophil %: 0 %
HCT: 37.7 % (ref 35.0–47.0)
Lymphocyte #: 1 x10 3/mm (ref 1.0–3.6)
MCHC: 33.5 g/dL (ref 32.0–36.0)
MCV: 95 fL (ref 80–100)
Monocyte %: 3.7 %
Neutrophil #: 12.9 x10 3/mm — ABNORMAL HIGH (ref 1.4–6.5)
Neutrophil %: 89.6 %
Platelet: 267 x10 3/mm (ref 150–440)
RBC: 3.97 10*6/uL (ref 3.80–5.20)
RDW: 13.4 % (ref 11.5–14.5)
WBC: 14.4 x10 3/mm — ABNORMAL HIGH (ref 3.6–11.0)

## 2013-02-04 LAB — BASIC METABOLIC PANEL
Anion Gap: 13 (ref 7–16)
Calcium, Total: 9.5 mg/dL (ref 8.5–10.1)
Chloride: 102 mmol/L (ref 98–107)
EGFR (African American): >60
EGFR (Non-African Amer.): 58 — ABNORMAL LOW
Glucose: 131 mg/dL — ABNORMAL HIGH (ref 65–99)
Sodium: 142 mmol/L (ref 136–145)

## 2013-02-11 LAB — CBC CANCER CENTER
Basophil #: 0.1 x10 3/mm (ref 0.0–0.1)
Eosinophil %: 0.9 %
HCT: 37.6 % (ref 35.0–47.0)
HGB: 13 g/dL (ref 12.0–16.0)
Lymphocyte #: 1.9 x10 3/mm (ref 1.0–3.6)
MCHC: 34.5 g/dL (ref 32.0–36.0)
MCV: 95 fL (ref 80–100)
Monocyte #: 1.7 x10 3/mm — ABNORMAL HIGH (ref 0.2–0.9)
Neutrophil #: 6.7 x10 3/mm — ABNORMAL HIGH (ref 1.4–6.5)

## 2013-02-13 ENCOUNTER — Ambulatory Visit: Payer: Self-pay | Admitting: Internal Medicine

## 2013-02-18 LAB — CBC CANCER CENTER
Basophil %: 0.5 %
Eosinophil %: 0.1 %
HGB: 12.7 g/dL (ref 12.0–16.0)
Lymphocyte #: 1.7 x10 3/mm (ref 1.0–3.6)
Lymphocyte %: 14.6 %
MCHC: 33.7 g/dL (ref 32.0–36.0)
MCV: 97 fL (ref 80–100)
Monocyte #: 0.8 x10 3/mm (ref 0.2–0.9)
Monocyte %: 7.3 %
Neutrophil %: 77.5 %
Platelet: 206 x10 3/mm (ref 150–440)
RBC: 3.88 10*6/uL (ref 3.80–5.20)
WBC: 11.6 x10 3/mm — ABNORMAL HIGH (ref 3.6–11.0)

## 2013-02-25 LAB — CBC CANCER CENTER
Basophil #: 0 x10 3/mm (ref 0.0–0.1)
Basophil %: 0.3 %
HCT: 36.8 % (ref 35.0–47.0)
Lymphocyte #: 1.8 x10 3/mm (ref 1.0–3.6)
Lymphocyte %: 15.6 %
MCH: 32.4 pg (ref 26.0–34.0)
MCV: 96 fL (ref 80–100)
Monocyte #: 1.1 x10 3/mm — ABNORMAL HIGH (ref 0.2–0.9)
Monocyte %: 10.1 %
Neutrophil #: 8.3 x10 3/mm — ABNORMAL HIGH (ref 1.4–6.5)
Neutrophil %: 73.9 %
Platelet: 221 x10 3/mm (ref 150–440)
RDW: 14.5 % (ref 11.5–14.5)
WBC: 11.3 x10 3/mm — ABNORMAL HIGH (ref 3.6–11.0)

## 2013-02-25 LAB — HEPATIC FUNCTION PANEL A (ARMC)
Alkaline Phosphatase: 94 U/L (ref 50–136)
SGOT(AST): 16 U/L (ref 15–37)
SGPT (ALT): 39 U/L (ref 12–78)
Total Protein: 7.1 g/dL (ref 6.4–8.2)

## 2013-02-25 LAB — BASIC METABOLIC PANEL
BUN: 21 mg/dL — ABNORMAL HIGH (ref 7–18)
Chloride: 100 mmol/L (ref 98–107)
Creatinine: 0.95 mg/dL (ref 0.60–1.30)
Osmolality: 281 (ref 275–301)
Sodium: 139 mmol/L (ref 136–145)

## 2013-03-04 LAB — CBC CANCER CENTER
Basophil %: 1.3 %
Eosinophil %: 1 %
HGB: 11.5 g/dL — ABNORMAL LOW (ref 12.0–16.0)
Lymphocyte #: 1.4 x10 3/mm (ref 1.0–3.6)
Lymphocyte %: 16 %
MCH: 32.9 pg (ref 26.0–34.0)
MCHC: 33.8 g/dL (ref 32.0–36.0)
Monocyte #: 1.4 x10 3/mm — ABNORMAL HIGH (ref 0.2–0.9)
Neutrophil %: 65.2 %
Platelet: 98 x10 3/mm — ABNORMAL LOW (ref 150–440)
RDW: 14.3 % (ref 11.5–14.5)

## 2013-03-11 LAB — CBC CANCER CENTER
Basophil #: 0.1 x10 3/mm (ref 0.0–0.1)
Basophil %: 0.7 %
Eosinophil %: 0.3 %
HGB: 12.3 g/dL (ref 12.0–16.0)
Lymphocyte #: 1.4 x10 3/mm (ref 1.0–3.6)
Lymphocyte %: 14.1 %
MCV: 98 fL (ref 80–100)
Monocyte #: 0.7 x10 3/mm (ref 0.2–0.9)
Monocyte %: 7.3 %
Neutrophil %: 77.6 %
RBC: 3.75 10*6/uL — ABNORMAL LOW (ref 3.80–5.20)
RDW: 15.3 % — ABNORMAL HIGH (ref 11.5–14.5)
WBC: 10.1 x10 3/mm (ref 3.6–11.0)

## 2013-03-16 ENCOUNTER — Ambulatory Visit: Payer: Self-pay | Admitting: Internal Medicine

## 2013-03-18 LAB — BASIC METABOLIC PANEL
Anion Gap: 9 (ref 7–16)
BUN: 17 mg/dL (ref 7–18)
Calcium, Total: 9.6 mg/dL (ref 8.5–10.1)
Co2: 27 mmol/L (ref 21–32)
Creatinine: 1.01 mg/dL (ref 0.60–1.30)
EGFR (African American): >60
EGFR (Non-African Amer.): >60
Osmolality: 280 (ref 275–301)

## 2013-03-18 LAB — CBC CANCER CENTER
Eosinophil %: 0 %
HGB: 12.3 g/dL (ref 12.0–16.0)
Lymphocyte #: 0.8 x10 3/mm — ABNORMAL LOW (ref 1.0–3.6)
MCH: 32.5 pg (ref 26.0–34.0)
MCV: 98 fL (ref 80–100)
Monocyte #: 0.7 x10 3/mm (ref 0.2–0.9)
Neutrophil #: 8.5 x10 3/mm — ABNORMAL HIGH (ref 1.4–6.5)
Neutrophil %: 85.1 %
Platelet: 238 x10 3/mm (ref 150–440)
RDW: 15.2 % — ABNORMAL HIGH (ref 11.5–14.5)
WBC: 10 x10 3/mm (ref 3.6–11.0)

## 2013-03-18 LAB — HEPATIC FUNCTION PANEL A (ARMC)
Bilirubin, Direct: 0.1 mg/dL (ref 0.00–0.20)
Bilirubin,Total: 0.4 mg/dL (ref 0.2–1.0)
SGOT(AST): 15 U/L (ref 15–37)
SGPT (ALT): 32 U/L (ref 12–78)
Total Protein: 7.2 g/dL (ref 6.4–8.2)

## 2013-03-21 ENCOUNTER — Other Ambulatory Visit: Payer: Self-pay

## 2013-03-25 LAB — CBC CANCER CENTER
Basophil #: 0.1 x10 3/mm (ref 0.0–0.1)
Basophil %: 0.9 %
Eosinophil #: 0 x10 3/mm (ref 0.0–0.7)
HCT: 35.4 % (ref 35.0–47.0)
HGB: 11.9 g/dL — ABNORMAL LOW (ref 12.0–16.0)
Lymphocyte %: 18.1 %
MCH: 33 pg (ref 26.0–34.0)
MCV: 98 fL (ref 80–100)
Monocyte %: 15.9 %
Neutrophil #: 7.1 x10 3/mm — ABNORMAL HIGH (ref 1.4–6.5)
Platelet: 101 x10 3/mm — ABNORMAL LOW (ref 150–440)
RBC: 3.6 10*6/uL — ABNORMAL LOW (ref 3.80–5.20)
RDW: 14.9 % — ABNORMAL HIGH (ref 11.5–14.5)

## 2013-03-28 ENCOUNTER — Encounter: Payer: Self-pay | Admitting: General Surgery

## 2013-03-28 ENCOUNTER — Ambulatory Visit (INDEPENDENT_AMBULATORY_CARE_PROVIDER_SITE_OTHER): Payer: BC Managed Care – PPO | Admitting: General Surgery

## 2013-03-28 VITALS — BP 128/68 | HR 76 | Resp 12 | Ht 66.0 in | Wt 138.0 lb

## 2013-03-28 DIAGNOSIS — C50219 Malignant neoplasm of upper-inner quadrant of unspecified female breast: Secondary | ICD-10-CM

## 2013-03-28 DIAGNOSIS — C50211 Malignant neoplasm of upper-inner quadrant of right female breast: Secondary | ICD-10-CM

## 2013-03-28 NOTE — Patient Instructions (Signed)
The patient has been asked to return to the office in two months for a unilateral right breast diagnostic mammogram. This has been arranged at [facility] for 06-04-13 at 11 am. She is aware of date, time, and instructions. Patient will follow up in the office after mammogram is complete.

## 2013-03-28 NOTE — Progress Notes (Signed)
Patient ID: Karen Washington, female   DOB: 07-04-1955, 57 y.o.   MRN: [id number]  Chief Complaint  Patient presents with  . Follow-up    breast cancer    HPI Karen Washington is a 57 y.o. female here today her three month breast cancer check up. Patient states she is doing well at this time, and having no problems with her chemotherapy. Patient had a right breast lumpectomy December 13 2012.   HPI  Past Medical History  Diagnosis Date  . Shingles 2014    Past Surgical History  Procedure Laterality Date  . Breast surgery Right December 13 2012    lumpectomy    Family History  Problem Relation Age of Onset  . Cancer Mother     uterine   . Cancer Maternal Grandmother     breast    Social History History  Substance Use Topics  . Smoking status: Never Smoker   . Smokeless tobacco: Never Used  . Alcohol Use: Yes    No Known Allergies  Current Outpatient Prescriptions  Medication Sig Dispense Refill  . ALPRAZolam (XANAX) 0.25 MG tablet Take 1 tablet (0.25 mg total) by mouth 2 (two) times daily as needed for sleep or anxiety.  20 tablet  0  . calcium carbonate (OS-CAL) 600 MG TABS Take 600 mg by mouth 2 (two) times daily with a meal.      . calcium-vitamin D (OSCAL WITH D) 500-200 MG-UNIT per tablet Take 1 tablet by mouth.      . cholecalciferol (VITAMIN D) 1000 UNITS tablet Take 1,000 Units by mouth daily.      Marland Kitchen dexamethasone (DECADRON) 4 MG tablet Take 4 mg by mouth 2 (two) times daily with a meal. 2 tabs 2 times a day before, the day of, and the day after chemo      . Ginger, Zingiber officinalis, (GINGER ROOT) 550 MG CAPS Take by mouth daily as needed.      Marland Kitchen ibuprofen (ADVIL,MOTRIN) 200 MG tablet Take 200 mg by mouth every 6 (six) hours as needed for pain.      . Multiple Vitamin (MULTIVITAMIN) tablet Take 1 tablet by mouth daily.      . Multiple Vitamins-Minerals ([employer] SPECTRAVITE ULTRA WOMEN PO) Take by mouth daily.      . promethazine (PHENERGAN) 25 MG tablet Take 1 tablet by  mouth daily.      Marland Kitchen pyridOXINE (VITAMIN B-6) 50 MG tablet Take 50 mg by mouth daily.       No current facility-administered medications for this visit.    Review of Systems Review of Systems  Constitutional: Negative.   Respiratory: Negative.   Cardiovascular: Negative.     Blood pressure 128/68, pulse 76, resp. rate 12, height 5\' 6"  (1.676 m), weight 138 lb (62.596 kg).  Physical Exam Physical Exam  Constitutional: She is oriented to person, place, and time. She appears well-developed and well-nourished.  Neck: Neck supple.  Cardiovascular: Normal rate, regular rhythm and normal heart sounds.   Pulmonary/Chest: Effort normal and breath sounds normal. Right breast exhibits no inverted nipple, no mass, no nipple discharge, no skin change and no tenderness. Left breast exhibits no inverted nipple, no mass, no nipple discharge, no skin change and no tenderness. Breasts are symmetrical.  Lymphadenopathy:    She has no cervical adenopathy.  Neurological: She is alert and oriented to person, place, and time.    Data Reviewed No new data.  Assessment    Doing well status post  breast conservation, tolerating adjuvant chemotherapy well.     Plan    We'll plan for a followup exam with a left breast mammogram in January 2015.     The patient has been asked to return to the office in two months for a unilateral right breast diagnostic mammogram. This has been arranged at [facility] for 06-04-13 at 11 am. She is aware of date, time, and instructions. Patient will follow up in the office after mammogram is complete.    Earline Mayotte 03/28/2013, 9:29 PM

## 2013-04-01 LAB — CBC CANCER CENTER
Basophil #: 0.1 x10 3/mm (ref 0.0–0.1)
Basophil %: 1.1 %
Lymphocyte #: 1.8 x10 3/mm (ref 1.0–3.6)
Lymphocyte %: 22.8 %
MCV: 99 fL (ref 80–100)
Monocyte #: 0.7 x10 3/mm (ref 0.2–0.9)
Neutrophil #: 5.3 x10 3/mm (ref 1.4–6.5)
Platelet: 211 x10 3/mm (ref 150–440)
RBC: 3.78 10*6/uL — ABNORMAL LOW (ref 3.80–5.20)
RDW: 15.4 % — ABNORMAL HIGH (ref 11.5–14.5)
WBC: 8 x10 3/mm (ref 3.6–11.0)

## 2013-04-08 LAB — CBC CANCER CENTER
Basophil #: 0 x10 3/mm (ref 0.0–0.1)
Basophil %: 0.2 %
Eosinophil %: 0 %
Lymphocyte #: 0.7 x10 3/mm — ABNORMAL LOW (ref 1.0–3.6)
Lymphocyte %: 7.3 %
Monocyte %: 7.2 %
Neutrophil %: 85.3 %
RBC: 3.55 10*6/uL — ABNORMAL LOW (ref 3.80–5.20)
WBC: 9.9 x10 3/mm (ref 3.6–11.0)

## 2013-04-08 LAB — HEPATIC FUNCTION PANEL A (ARMC)
Alkaline Phosphatase: 82 U/L
Bilirubin, Direct: 0.1 mg/dL (ref 0.00–0.20)
Bilirubin,Total: 0.3 mg/dL (ref 0.2–1.0)

## 2013-04-08 LAB — BASIC METABOLIC PANEL
Anion Gap: 9 (ref 7–16)
BUN: 25 mg/dL — ABNORMAL HIGH (ref 7–18)
Co2: 27 mmol/L (ref 21–32)
Creatinine: 0.97 mg/dL (ref 0.60–1.30)
EGFR (African American): >60
EGFR (Non-African Amer.): >60
Potassium: 4.4 mmol/L (ref 3.5–5.1)

## 2013-04-15 ENCOUNTER — Ambulatory Visit: Payer: Self-pay | Admitting: Internal Medicine

## 2013-04-15 LAB — CBC CANCER CENTER
Basophil #: 0.1 x10 3/mm (ref 0.0–0.1)
Basophil %: 0.9 %
Eosinophil #: 0 x10 3/mm (ref 0.0–0.7)
Lymphocyte %: 17.9 %
MCHC: 33.9 g/dL (ref 32.0–36.0)
Monocyte #: 1.4 x10 3/mm — ABNORMAL HIGH (ref 0.2–0.9)
Monocyte %: 20.5 %
Neutrophil #: 4.1 x10 3/mm (ref 1.4–6.5)
Neutrophil %: 60 %
Platelet: 87 x10 3/mm — ABNORMAL LOW (ref 150–440)
RBC: 3.52 10*6/uL — ABNORMAL LOW (ref 3.80–5.20)
RDW: 15.3 % — ABNORMAL HIGH (ref 11.5–14.5)
WBC: 6.8 x10 3/mm (ref 3.6–11.0)

## 2013-04-22 LAB — CBC CANCER CENTER
Basophil #: 0.1 x10 3/mm (ref 0.0–0.1)
Eosinophil #: 0 x10 3/mm (ref 0.0–0.7)
HCT: 35.9 % (ref 35.0–47.0)
Lymphocyte #: 1.8 x10 3/mm (ref 1.0–3.6)
Lymphocyte %: 13.9 %
MCHC: 32.8 g/dL (ref 32.0–36.0)
Monocyte #: 1 x10 3/mm — ABNORMAL HIGH (ref 0.2–0.9)
Neutrophil #: 9.8 x10 3/mm — ABNORMAL HIGH (ref 1.4–6.5)
Neutrophil %: 77.9 %
RBC: 3.54 10*6/uL — ABNORMAL LOW (ref 3.80–5.20)
WBC: 12.6 x10 3/mm — ABNORMAL HIGH (ref 3.6–11.0)

## 2013-04-29 LAB — CBC CANCER CENTER
Basophil #: 0 x10 3/mm (ref 0.0–0.1)
Basophil %: 0.4 %
Eosinophil #: 0 x10 3/mm (ref 0.0–0.7)
Eosinophil %: 0.1 %
HCT: 35.6 % (ref 35.0–47.0)
HGB: 11.9 g/dL — ABNORMAL LOW (ref 12.0–16.0)
Lymphocyte #: 1 x10 3/mm (ref 1.0–3.6)
MCHC: 33.3 g/dL (ref 32.0–36.0)
MCV: 101 fL — ABNORMAL HIGH (ref 80–100)
Monocyte #: 1 x10 3/mm — ABNORMAL HIGH (ref 0.2–0.9)
Neutrophil #: 8.9 x10 3/mm — ABNORMAL HIGH (ref 1.4–6.5)
Platelet: 227 x10 3/mm (ref 150–440)
RDW: 15 % — ABNORMAL HIGH (ref 11.5–14.5)
WBC: 10.9 x10 3/mm (ref 3.6–11.0)

## 2013-04-29 LAB — BASIC METABOLIC PANEL
Anion Gap: 9 (ref 7–16)
BUN: 21 mg/dL — ABNORMAL HIGH (ref 7–18)
Calcium, Total: 9.2 mg/dL (ref 8.5–10.1)
Co2: 28 mmol/L (ref 21–32)
Creatinine: 1.11 mg/dL (ref 0.60–1.30)
EGFR (African American): >60
EGFR (Non-African Amer.): 56 — ABNORMAL LOW
Sodium: 139 mmol/L (ref 136–145)

## 2013-04-29 LAB — HEPATIC FUNCTION PANEL A (ARMC)
Albumin: 4 g/dL (ref 3.4–5.0)
Alkaline Phosphatase: 75 U/L
Bilirubin, Direct: 0.1 mg/dL (ref 0.00–0.20)
Bilirubin,Total: 0.3 mg/dL (ref 0.2–1.0)
SGPT (ALT): 34 U/L (ref 12–78)

## 2013-05-06 LAB — CBC CANCER CENTER
Basophil #: 0 x10 3/mm (ref 0.0–0.1)
Basophil %: 0.6 %
HGB: 11.5 g/dL — ABNORMAL LOW (ref 12.0–16.0)
Lymphocyte #: 1.3 x10 3/mm (ref 1.0–3.6)
MCH: 33.5 pg (ref 26.0–34.0)
MCV: 102 fL — ABNORMAL HIGH (ref 80–100)
Monocyte %: 20.6 %
Neutrophil #: 4.2 x10 3/mm (ref 1.4–6.5)
Neutrophil %: 59.6 %
Platelet: 72 x10 3/mm — ABNORMAL LOW (ref 150–440)
RBC: 3.44 10*6/uL — ABNORMAL LOW (ref 3.80–5.20)
RDW: 14.5 % (ref 11.5–14.5)
WBC: 7 x10 3/mm (ref 3.6–11.0)

## 2013-05-13 LAB — CBC CANCER CENTER
Eosinophil #: 0 x10 3/mm (ref 0.0–0.7)
Eosinophil %: 0.4 %
HCT: 36.1 % (ref 35.0–47.0)
HGB: 11.8 g/dL — ABNORMAL LOW (ref 12.0–16.0)
Lymphocyte #: 1 x10 3/mm (ref 1.0–3.6)
MCH: 33.4 pg (ref 26.0–34.0)
MCHC: 32.7 g/dL (ref 32.0–36.0)
MCV: 102 fL — ABNORMAL HIGH (ref 80–100)
Monocyte #: 0.5 x10 3/mm (ref 0.2–0.9)
Monocyte %: 8.2 %
Neutrophil #: 4.3 x10 3/mm (ref 1.4–6.5)
Neutrophil %: 73 %
Platelet: 177 x10 3/mm (ref 150–440)
RDW: 14.7 % — ABNORMAL HIGH (ref 11.5–14.5)
WBC: 5.8 x10 3/mm (ref 3.6–11.0)

## 2013-05-16 ENCOUNTER — Ambulatory Visit: Payer: Self-pay | Admitting: Internal Medicine

## 2013-05-16 DIAGNOSIS — Z923 Personal history of irradiation: Secondary | ICD-10-CM

## 2013-05-16 HISTORY — DX: Personal history of irradiation: Z92.3

## 2013-05-20 LAB — HEPATIC FUNCTION PANEL A (ARMC)
ALBUMIN: 3.5 g/dL (ref 3.4–5.0)
Alkaline Phosphatase: 65 U/L
Bilirubin, Direct: 0.1 mg/dL (ref 0.00–0.20)
Bilirubin,Total: 0.2 mg/dL (ref 0.2–1.0)
SGOT(AST): 20 U/L (ref 15–37)
SGPT (ALT): 31 U/L (ref 12–78)
TOTAL PROTEIN: 6.1 g/dL — AB (ref 6.4–8.2)

## 2013-05-20 LAB — CBC CANCER CENTER
BASOS ABS: 0.1 x10 3/mm (ref 0.0–0.1)
Basophil %: 1.1 %
EOS ABS: 0 x10 3/mm (ref 0.0–0.7)
Eosinophil %: 0.7 %
HCT: 35.6 % (ref 35.0–47.0)
HGB: 11.7 g/dL — ABNORMAL LOW (ref 12.0–16.0)
LYMPHS PCT: 15.3 %
Lymphocyte #: 0.9 x10 3/mm — ABNORMAL LOW (ref 1.0–3.6)
MCH: 33.6 pg (ref 26.0–34.0)
MCHC: 32.8 g/dL (ref 32.0–36.0)
MCV: 102 fL — AB (ref 80–100)
MONO ABS: 0.8 x10 3/mm (ref 0.2–0.9)
Monocyte %: 13 %
NEUTROS ABS: 4.2 x10 3/mm (ref 1.4–6.5)
Neutrophil %: 69.9 %
Platelet: 195 x10 3/mm (ref 150–440)
RBC: 3.48 10*6/uL — AB (ref 3.80–5.20)
RDW: 14.5 % (ref 11.5–14.5)
WBC: 6 x10 3/mm (ref 3.6–11.0)

## 2013-05-20 LAB — CREATININE, SERUM
Creatinine: 0.95 mg/dL (ref 0.60–1.30)
EGFR (Non-African Amer.): >60

## 2013-06-10 LAB — CBC CANCER CENTER
BASOS ABS: 0 x10 3/mm (ref 0.0–0.1)
BASOS PCT: 0.8 %
EOS ABS: 0.3 x10 3/mm (ref 0.0–0.7)
Eosinophil %: 4.4 %
HCT: 37.9 % (ref 35.0–47.0)
HGB: 12.5 g/dL (ref 12.0–16.0)
Lymphocyte #: 1.1 x10 3/mm (ref 1.0–3.6)
Lymphocyte %: 16.4 %
MCH: 33 pg (ref 26.0–34.0)
MCHC: 32.9 g/dL (ref 32.0–36.0)
MCV: 100 fL (ref 80–100)
MONO ABS: 0.5 x10 3/mm (ref 0.2–0.9)
Monocyte %: 7.9 %
Neutrophil #: 4.6 x10 3/mm (ref 1.4–6.5)
Neutrophil %: 70.5 %
Platelet: 216 x10 3/mm (ref 150–440)
RBC: 3.78 10*6/uL — ABNORMAL LOW (ref 3.80–5.20)
RDW: 13.3 % (ref 11.5–14.5)
WBC: 6.6 x10 3/mm (ref 3.6–11.0)

## 2013-06-10 LAB — HEPATIC FUNCTION PANEL A (ARMC)
ALK PHOS: 67 U/L
Albumin: 3.7 g/dL (ref 3.4–5.0)
Bilirubin, Direct: 0.1 mg/dL (ref 0.00–0.20)
Bilirubin,Total: 0.3 mg/dL (ref 0.2–1.0)
SGOT(AST): 25 U/L (ref 15–37)
SGPT (ALT): 36 U/L (ref 12–78)
Total Protein: 6.6 g/dL (ref 6.4–8.2)

## 2013-06-10 LAB — CREATININE, SERUM: Creatinine: 0.91 mg/dL (ref 0.60–1.30)

## 2013-06-12 ENCOUNTER — Ambulatory Visit (INDEPENDENT_AMBULATORY_CARE_PROVIDER_SITE_OTHER): Payer: BC Managed Care – PPO | Admitting: General Surgery

## 2013-06-12 ENCOUNTER — Encounter: Payer: Self-pay | Admitting: General Surgery

## 2013-06-12 ENCOUNTER — Other Ambulatory Visit: Payer: BC Managed Care – PPO

## 2013-06-12 VITALS — BP 98/70 | HR 80 | Resp 12 | Ht 65.5 in | Wt 137.0 lb

## 2013-06-12 DIAGNOSIS — C50211 Malignant neoplasm of upper-inner quadrant of right female breast: Secondary | ICD-10-CM

## 2013-06-12 DIAGNOSIS — C50219 Malignant neoplasm of upper-inner quadrant of unspecified female breast: Secondary | ICD-10-CM

## 2013-06-12 NOTE — Progress Notes (Signed)
Patient ID: Karen Washington, female   DOB: December 24, 1955, 58 y.o.   MRN: [id number]  Chief Complaint  Patient presents with  . Follow-up    right diagnostic mammogram     HPI Karen Washington is a 58 y.o. female who presents for a follow up breast cancer check. Her last mammogram was performed on 06/04/13. The patient does not do self breast checks on a regular basis but does get regular mammograms. The patient denies any new problems with the breasts at this time. The patient is currently undergoing radiation therapy.    HPI  Past Medical History  Diagnosis Date  . Shingles 2014  . Cancer 2013    right breast cancer    Past Surgical History  Procedure Laterality Date  . Breast surgery Right December 13 2012    lumpectomy  . Portacath placement  2014    Family History  Problem Relation Age of Onset  . Cancer Mother     uterine   . Cancer Maternal Grandmother     breast  . Cancer Father     prostate    Social History History  Substance Use Topics  . Smoking status: Never Smoker   . Smokeless tobacco: Never Used  . Alcohol Use: Yes    No Known Allergies  Current Outpatient Prescriptions  Medication Sig Dispense Refill  . ALPRAZolam (XANAX) 0.25 MG tablet Take 1 tablet (0.25 mg total) by mouth 2 (two) times daily as needed for sleep or anxiety.  20 tablet  0  . calcium carbonate (OS-CAL) 600 MG TABS Take 600 mg by mouth 2 (two) times daily with a meal.      . cholecalciferol (VITAMIN D) 1000 UNITS tablet Take 1,000 Units by mouth daily.      Marland Kitchen ibuprofen (ADVIL,MOTRIN) 200 MG tablet Take 200 mg by mouth every 6 (six) hours as needed for pain.      . Multiple Vitamins-Minerals ([employer]S SPECTRAVITE ULTRA WOMEN PO) Take by mouth daily.      . promethazine (PHENERGAN) 25 MG tablet Take 1 tablet by mouth daily.       No current facility-administered medications for this visit.    Review of Systems Review of Systems  Constitutional: Negative.   Respiratory: Negative.   Cardiovascular:  Negative.     Blood pressure 98/70, pulse 80, resp. rate 12, height 5' 5.5" (1.664 m), weight 137 lb (62.143 kg).  Physical Exam Physical Exam  Constitutional: She is oriented to person, place, and time. She appears well-developed and well-nourished.  Neck: Neck supple. No thyromegaly present.  Cardiovascular: Normal rate, regular rhythm and normal heart sounds.   No murmur heard. Pulmonary/Chest: Effort normal and breath sounds normal. Right breast exhibits no inverted nipple, no mass, no nipple discharge, no skin change and no tenderness. Left breast exhibits no inverted nipple, no mass, no nipple discharge, no skin change and no tenderness.  Fullness in the axillary tail of the right more so than the left breast.  This may be secondary to the mastoplasty completed at the time of her wide excision.    Lymphadenopathy:    She has no cervical adenopathy.    She has no axillary adenopathy.  Neurological: She is alert and oriented to person, place, and time.  Skin: Skin is warm and dry.    Data Reviewed Right breast mammogram dated June 04, 2013 was reviewed. Dense breasts are noted. Architectural distortion appreciated. BI-RAD 3.  Ultrasound examination of the upper outer quadrant of  the right breast was undertaken to assess the palpable fullness. Uniform heterogeneous breast parenchyma is noted with the exception of a small 0.4 x 0.6 x 0.63 cm hypoechoic oval area with posterior acoustic enhancement and good edge effect in the 10:00 position, 6 cm from the nipple. 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  Assessment    Doing well status post adjuvant chemotherapy for right breast cancer. Recent initiation of radiation therapy.     Plan    Arrangements will be made for bilateral diagnostic mammograms at [facility] 6 months.        Karen Washington 06/12/2013, 8:48 PM

## 2013-06-12 NOTE — Patient Instructions (Addendum)
Patient to call with any questions or concerns. Patient to return in 6 months.

## 2013-06-16 ENCOUNTER — Ambulatory Visit: Payer: Self-pay | Admitting: Internal Medicine

## 2013-06-17 LAB — CBC CANCER CENTER
BASOS PCT: 0.8 %
Basophil #: 0 x10 3/mm (ref 0.0–0.1)
Eosinophil #: 0.3 x10 3/mm (ref 0.0–0.7)
Eosinophil %: 6.5 %
HCT: 39.9 % (ref 35.0–47.0)
HGB: 13.2 g/dL (ref 12.0–16.0)
LYMPHS ABS: 0.7 x10 3/mm — AB (ref 1.0–3.6)
LYMPHS PCT: 16.7 %
MCH: 32.9 pg (ref 26.0–34.0)
MCHC: 33 g/dL (ref 32.0–36.0)
MCV: 100 fL (ref 80–100)
Monocyte #: 0.4 x10 3/mm (ref 0.2–0.9)
Monocyte %: 8.4 %
Neutrophil #: 3 x10 3/mm (ref 1.4–6.5)
Neutrophil %: 67.6 %
Platelet: 172 x10 3/mm (ref 150–440)
RBC: 4 10*6/uL (ref 3.80–5.20)
RDW: 13.5 % (ref 11.5–14.5)
WBC: 4.4 x10 3/mm (ref 3.6–11.0)

## 2013-06-24 LAB — CBC CANCER CENTER
BASOS PCT: 0.6 %
Basophil #: 0 x10 3/mm (ref 0.0–0.1)
EOS ABS: 0.2 x10 3/mm (ref 0.0–0.7)
Eosinophil %: 4.5 %
HCT: 38.8 % (ref 35.0–47.0)
HGB: 12.7 g/dL (ref 12.0–16.0)
LYMPHS ABS: 0.8 x10 3/mm — AB (ref 1.0–3.6)
Lymphocyte %: 17.8 %
MCH: 32.6 pg (ref 26.0–34.0)
MCHC: 32.7 g/dL (ref 32.0–36.0)
MCV: 100 fL (ref 80–100)
MONO ABS: 0.4 x10 3/mm (ref 0.2–0.9)
Monocyte %: 10 %
NEUTROS PCT: 67.1 %
Neutrophil #: 2.9 x10 3/mm (ref 1.4–6.5)
Platelet: 164 x10 3/mm (ref 150–440)
RBC: 3.89 10*6/uL (ref 3.80–5.20)
RDW: 13.2 % (ref 11.5–14.5)
WBC: 4.4 x10 3/mm (ref 3.6–11.0)

## 2013-07-01 LAB — CBC CANCER CENTER
BASOS ABS: 0 x10 3/mm (ref 0.0–0.1)
Basophil %: 0.8 %
Eosinophil #: 0.2 x10 3/mm (ref 0.0–0.7)
Eosinophil %: 2.8 %
HCT: 39.5 % (ref 35.0–47.0)
HGB: 12.9 g/dL (ref 12.0–16.0)
LYMPHS ABS: 1 x10 3/mm (ref 1.0–3.6)
LYMPHS PCT: 16.5 %
MCH: 32.3 pg (ref 26.0–34.0)
MCHC: 32.8 g/dL (ref 32.0–36.0)
MCV: 98 fL (ref 80–100)
MONOS PCT: 9.1 %
Monocyte #: 0.5 x10 3/mm (ref 0.2–0.9)
NEUTROS ABS: 4.1 x10 3/mm (ref 1.4–6.5)
Neutrophil %: 70.8 %
Platelet: 185 x10 3/mm (ref 150–440)
RBC: 4.01 10*6/uL (ref 3.80–5.20)
RDW: 13.2 % (ref 11.5–14.5)
WBC: 5.8 x10 3/mm (ref 3.6–11.0)

## 2013-07-08 LAB — CBC CANCER CENTER
BASOS PCT: 0.8 %
Basophil #: 0 x10 3/mm (ref 0.0–0.1)
EOS ABS: 0.2 x10 3/mm (ref 0.0–0.7)
Eosinophil %: 4 %
HCT: 41 % (ref 35.0–47.0)
HGB: 13.4 g/dL (ref 12.0–16.0)
LYMPHS PCT: 20.1 %
Lymphocyte #: 0.8 x10 3/mm — ABNORMAL LOW (ref 1.0–3.6)
MCH: 32 pg (ref 26.0–34.0)
MCHC: 32.8 g/dL (ref 32.0–36.0)
MCV: 98 fL (ref 80–100)
Monocyte #: 0.5 x10 3/mm (ref 0.2–0.9)
Monocyte %: 11.1 %
NEUTROS ABS: 2.7 x10 3/mm (ref 1.4–6.5)
NEUTROS PCT: 64 %
Platelet: 160 x10 3/mm (ref 150–440)
RBC: 4.19 10*6/uL (ref 3.80–5.20)
RDW: 13.2 % (ref 11.5–14.5)
WBC: 4.2 x10 3/mm (ref 3.6–11.0)

## 2013-07-14 ENCOUNTER — Ambulatory Visit: Payer: Self-pay | Admitting: Internal Medicine

## 2013-07-23 ENCOUNTER — Telehealth: Payer: Self-pay | Admitting: Internal Medicine

## 2013-07-23 NOTE — Telephone Encounter (Signed)
I received a letter from her [employer] about the need for quarterly ECHOcardiograms to monitor for cardiotoxicity associated with use of Herceptin , which Dr. Ma Hillock is using to treat her breast cancer.  If she is still receiving Herceptin therapy, has she had an ECHO of her heart in the last 6 months?

## 2013-07-24 NOTE — Telephone Encounter (Signed)
Left message for patient to return call to office. 

## 2013-07-25 NOTE — Telephone Encounter (Signed)
Patient returned your call.

## 2013-07-25 NOTE — Telephone Encounter (Signed)
Left message for patient to return call to office. 

## 2013-07-26 NOTE — Telephone Encounter (Signed)
Patient left message will not be in town til Monday.

## 2013-07-29 NOTE — Telephone Encounter (Signed)
Left message for patient to return call to office. 

## 2013-08-01 NOTE — Telephone Encounter (Signed)
Left message for patient to return call to office. Letter mailed today to patient unable to reach on mobile or home phone and has not returned call.

## 2013-08-02 NOTE — Telephone Encounter (Signed)
Patient stated she had a MUGA scan from Dr. Ma Hillock, in the last month or two and one when starting Herceptin.

## 2013-08-12 LAB — CBC CANCER CENTER
BASOS PCT: 0.7 %
Basophil #: 0 x10 3/mm (ref 0.0–0.1)
Eosinophil #: 0.1 x10 3/mm (ref 0.0–0.7)
Eosinophil %: 2.3 %
HCT: 41.4 % (ref 35.0–47.0)
HGB: 13.5 g/dL (ref 12.0–16.0)
Lymphocyte #: 0.8 x10 3/mm — ABNORMAL LOW (ref 1.0–3.6)
Lymphocyte %: 15 %
MCH: 31.8 pg (ref 26.0–34.0)
MCHC: 32.7 g/dL (ref 32.0–36.0)
MCV: 97 fL (ref 80–100)
Monocyte #: 0.5 x10 3/mm (ref 0.2–0.9)
Monocyte %: 9.3 %
Neutrophil #: 3.9 x10 3/mm (ref 1.4–6.5)
Neutrophil %: 72.7 %
Platelet: 189 x10 3/mm (ref 150–440)
RBC: 4.25 10*6/uL (ref 3.80–5.20)
RDW: 13.1 % (ref 11.5–14.5)
WBC: 5.4 x10 3/mm (ref 3.6–11.0)

## 2013-08-12 LAB — COMPREHENSIVE METABOLIC PANEL
ALK PHOS: 64 U/L
ALT: 39 U/L (ref 12–78)
Albumin: 3.9 g/dL (ref 3.4–5.0)
Anion Gap: 8 (ref 7–16)
BUN: 23 mg/dL — ABNORMAL HIGH (ref 7–18)
Bilirubin,Total: 0.4 mg/dL (ref 0.2–1.0)
CHLORIDE: 103 mmol/L (ref 98–107)
CREATININE: 1.08 mg/dL (ref 0.60–1.30)
Calcium, Total: 9.5 mg/dL (ref 8.5–10.1)
Co2: 30 mmol/L (ref 21–32)
EGFR (Non-African Amer.): 58 — ABNORMAL LOW
Glucose: 94 mg/dL (ref 65–99)
Osmolality: 285 (ref 275–301)
Potassium: 4.7 mmol/L (ref 3.5–5.1)
SGOT(AST): 26 U/L (ref 15–37)
Sodium: 141 mmol/L (ref 136–145)
Total Protein: 7.2 g/dL (ref 6.4–8.2)

## 2013-08-14 ENCOUNTER — Ambulatory Visit: Payer: Self-pay | Admitting: Internal Medicine

## 2013-09-13 ENCOUNTER — Ambulatory Visit: Payer: Self-pay | Admitting: Internal Medicine

## 2013-10-10 ENCOUNTER — Encounter: Payer: BC Managed Care – PPO | Admitting: Internal Medicine

## 2013-10-14 ENCOUNTER — Ambulatory Visit: Payer: Self-pay | Admitting: Internal Medicine

## 2013-10-14 LAB — CBC CANCER CENTER
BASOS ABS: 0 x10 3/mm (ref 0.0–0.1)
Basophil %: 0.8 %
EOS ABS: 0.1 x10 3/mm (ref 0.0–0.7)
Eosinophil %: 2.6 %
HCT: 39.3 % (ref 35.0–47.0)
HGB: 13.2 g/dL (ref 12.0–16.0)
Lymphocyte #: 0.9 x10 3/mm — ABNORMAL LOW (ref 1.0–3.6)
Lymphocyte %: 19.6 %
MCH: 32.4 pg (ref 26.0–34.0)
MCHC: 33.6 g/dL (ref 32.0–36.0)
MCV: 97 fL (ref 80–100)
MONO ABS: 0.4 x10 3/mm (ref 0.2–0.9)
MONOS PCT: 9.6 %
Neutrophil #: 3 x10 3/mm (ref 1.4–6.5)
Neutrophil %: 67.4 %
PLATELETS: 206 x10 3/mm (ref 150–440)
RBC: 4.07 10*6/uL (ref 3.80–5.20)
RDW: 13.6 % (ref 11.5–14.5)
WBC: 4.5 x10 3/mm (ref 3.6–11.0)

## 2013-10-14 LAB — COMPREHENSIVE METABOLIC PANEL
Albumin: 3.9 g/dL (ref 3.4–5.0)
Alkaline Phosphatase: 64 U/L
Anion Gap: 8 (ref 7–16)
BILIRUBIN TOTAL: 0.5 mg/dL (ref 0.2–1.0)
BUN: 22 mg/dL — AB (ref 7–18)
CHLORIDE: 105 mmol/L (ref 98–107)
CREATININE: 1 mg/dL (ref 0.60–1.30)
Calcium, Total: 9.8 mg/dL (ref 8.5–10.1)
Co2: 32 mmol/L (ref 21–32)
EGFR (African American): >60
EGFR (Non-African Amer.): >60
Glucose: 97 mg/dL (ref 65–99)
Osmolality: 292 (ref 275–301)
POTASSIUM: 4.4 mmol/L (ref 3.5–5.1)
SGOT(AST): 17 U/L (ref 15–37)
SGPT (ALT): 29 U/L (ref 12–78)
Sodium: 145 mmol/L (ref 136–145)
Total Protein: 6.9 g/dL (ref 6.4–8.2)

## 2013-11-13 ENCOUNTER — Ambulatory Visit: Payer: Self-pay | Admitting: Internal Medicine

## 2013-12-14 ENCOUNTER — Ambulatory Visit: Payer: Self-pay | Admitting: Internal Medicine

## 2013-12-17 ENCOUNTER — Encounter: Payer: Self-pay | Admitting: General Surgery

## 2013-12-23 ENCOUNTER — Other Ambulatory Visit: Payer: BC Managed Care – PPO

## 2013-12-23 ENCOUNTER — Encounter: Payer: Self-pay | Admitting: General Surgery

## 2013-12-23 ENCOUNTER — Ambulatory Visit (INDEPENDENT_AMBULATORY_CARE_PROVIDER_SITE_OTHER): Payer: BC Managed Care – PPO | Admitting: General Surgery

## 2013-12-23 VITALS — BP 110/70 | HR 68 | Resp 12 | Ht 65.0 in | Wt 136.0 lb

## 2013-12-23 DIAGNOSIS — C50211 Malignant neoplasm of upper-inner quadrant of right female breast: Secondary | ICD-10-CM

## 2013-12-23 DIAGNOSIS — C50219 Malignant neoplasm of upper-inner quadrant of unspecified female breast: Secondary | ICD-10-CM

## 2013-12-23 NOTE — Progress Notes (Signed)
Patient ID: Karen Washington, female   DOB: 1955/11/13, 58 y.o.   MRN: [id number]  Chief Complaint  Patient presents with  . Follow-up    mammogram    HPI Karen Washington is a 58 y.o. female who presents for a breast evaluation. The most recent mammogram was done on 12/12/13. Patient does perform regular self breast checkas and gets regular mammograms done.  No new problems with the breasts at this time.   HPI  Past Medical History  Diagnosis Date  . Shingles 2014  . 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     Past Surgical History  Procedure Laterality Date  . Breast surgery Right December 13 2012    lumpectomy  . Portacath placement  2014    Family History  Problem Relation Age of Onset  . Cancer Mother     uterine   . Cancer Maternal Grandmother     breast  . Cancer Father     prostate    Social History History  Substance Use Topics  . Smoking status: Never Smoker   . Smokeless tobacco: Never Used  . Alcohol Use: Yes    No Known Allergies  Current Outpatient Prescriptions  Medication Sig Dispense Refill  . ALPRAZolam (XANAX) 0.25 MG tablet Take 1 tablet (0.25 mg total) by mouth 2 (two) times daily as needed for sleep or anxiety.  20 tablet  0  . calcium carbonate (OS-CAL) 600 MG TABS Take 600 mg by mouth 2 (two) times daily with a meal.      . cholecalciferol (VITAMIN D) 1000 UNITS tablet Take 1,000 Units by mouth daily.      Marland Kitchen ibuprofen (ADVIL,MOTRIN) 200 MG tablet Take 200 mg by mouth every 6 (six) hours as needed for pain.      . Multiple Vitamins-Minerals ([employer]S SPECTRAVITE ULTRA WOMEN PO) Take by mouth daily.       No current facility-administered medications for this visit.    Review of Systems Review of Systems  Constitutional: Negative.   Respiratory: Negative.   Cardiovascular: Negative.     Blood pressure 110/70, pulse 68, resp. rate 12, height 5'  5" (1.651 m), weight 136 lb (61.689 kg).  Physical Exam Physical Exam  Constitutional: She is oriented to person, place, and time. She appears well-developed and well-nourished.  Neck: Neck supple. No thyromegaly present.  Cardiovascular: Normal rate, regular rhythm and normal heart sounds.   No murmur heard. Pulmonary/Chest: Effort normal and breath sounds normal. Right breast exhibits no inverted nipple, no mass, no nipple discharge, no skin change and no tenderness. Left breast exhibits no inverted nipple, no mass, no nipple discharge, no skin change and no tenderness.  Little thickening in the axillary tail of the left breast.    Lymphadenopathy:    She has no cervical adenopathy.    She has no axillary adenopathy.  Neurological: She is alert and oriented to person, place, and time.  Skin: Skin is warm and dry.    Data Reviewed An ultrasound examination is recorded on the right breast was completed to reassess the previous smoothly rounded nodule noted at 10 o'clock position. In the 11 o'clock position, 6 cm from the nipple is a poorly defined hypoechoic smoothly marginated nodule measuring 0.55 x 0.66 x 0.74 cm. Acoustic enhancement is identified. Clear a defect noted. Previously this measured 0.4 x 0.6 x  0.63 cm at the time of her January 2015 exam. This remains a likely a small fibroadenoma with the 1 mm change in size secondary to technique. Continued observation would be appropriate. BI-RAD-3.  Previously completed bilateral mammograms completed December 17, 2013 at [facility] shows architectural distortion in the upper-outer quadrant of the right breast consistent with previous scarring. Examination of the left breast is unremarkable. BI-RAD-2. Re-review in light of the ultrasound findings was completed. No corresponding mammographic abnormalities noted to correlate with the small 7 mm lesion on ultrasound.  Assessment    Doing well status post treatment of a HER-2 positive right breast  cancer. Scheduled to complete U. Adjuvant chemotherapy with Herceptin in August 2015.     Plan    The patient is anxious to have her port removed as it is irritated by the shoulder harness while driving. As soon as approval was obtained from the medical oncology service this will be completed as an outpatient procedure.  There is proximally 1 cm ptosis of the left breast in compared to the right post mammoplasty and radiation. The patient is aware of this, especially when she is wearing a bathing suit. I've encouraged her to meet with the plastic surgery service further input regarding options for symmetry and timing of surgery.  Arrangements were made for assessment with Nicholaus Bloom, MD in the near future in this regard.  We will plan on a followup clinical exam an office ultrasound in 6 months.     PCP: Wende Mott 12/23/2013, 9:46 PM   a

## 2013-12-23 NOTE — Patient Instructions (Addendum)
Patient to return in 6 months for office ultrasound. Continue self breast exams. Call office for any new breast issues or concerns.

## 2013-12-25 ENCOUNTER — Telehealth: Payer: Self-pay | Admitting: *Deleted

## 2013-12-25 NOTE — Telephone Encounter (Signed)
Records have been forwarded to Dr. Edwin Dada office requesting an appointment. April at Dr. Edwin Dada office confirmed that they received records.   Dr. Edwin Dada office will be in contact with patient to arrange day and time.

## 2013-12-25 NOTE — Telephone Encounter (Signed)
Message copied by Dominga Ferry on Wed Dec 25, 2013 11:43 AM ------      Message from: [place], Forest Gleason      Created: Mon Dec 23, 2013  9:47 PM       Please make a referral to Nicholaus Bloom, MD regarding breast symmetry surgery. Thank you ------

## 2014-01-06 LAB — CBC CANCER CENTER
BASOS PCT: 0.6 %
Basophil #: 0 x10 3/mm (ref 0.0–0.1)
EOS ABS: 0.1 x10 3/mm (ref 0.0–0.7)
Eosinophil %: 2 %
HCT: 40.1 % (ref 35.0–47.0)
HGB: 13 g/dL (ref 12.0–16.0)
Lymphocyte #: 1.3 x10 3/mm (ref 1.0–3.6)
Lymphocyte %: 23.1 %
MCH: 32.2 pg (ref 26.0–34.0)
MCHC: 32.5 g/dL (ref 32.0–36.0)
MCV: 99 fL (ref 80–100)
MONO ABS: 0.5 x10 3/mm (ref 0.2–0.9)
MONOS PCT: 8.7 %
Neutrophil #: 3.7 x10 3/mm (ref 1.4–6.5)
Neutrophil %: 65.6 %
Platelet: 201 x10 3/mm (ref 150–440)
RBC: 4.04 10*6/uL (ref 3.80–5.20)
RDW: 12.7 % (ref 11.5–14.5)
WBC: 5.7 x10 3/mm (ref 3.6–11.0)

## 2014-01-06 LAB — COMPREHENSIVE METABOLIC PANEL
ALBUMIN: 4 g/dL (ref 3.4–5.0)
Alkaline Phosphatase: 61 U/L
Anion Gap: 5 — ABNORMAL LOW (ref 7–16)
BUN: 16 mg/dL (ref 7–18)
Bilirubin,Total: 0.4 mg/dL (ref 0.2–1.0)
CALCIUM: 9.2 mg/dL (ref 8.5–10.1)
Chloride: 106 mmol/L (ref 98–107)
Co2: 31 mmol/L (ref 21–32)
Creatinine: 0.88 mg/dL (ref 0.60–1.30)
EGFR (African American): >60
Glucose: 80 mg/dL (ref 65–99)
OSMOLALITY: 283 (ref 275–301)
POTASSIUM: 3.8 mmol/L (ref 3.5–5.1)
SGOT(AST): 16 U/L (ref 15–37)
SGPT (ALT): 24 U/L
Sodium: 142 mmol/L (ref 136–145)
Total Protein: 7 g/dL (ref 6.4–8.2)

## 2014-01-14 ENCOUNTER — Ambulatory Visit: Payer: Self-pay | Admitting: Internal Medicine

## 2014-01-27 ENCOUNTER — Encounter: Payer: BC Managed Care – PPO | Admitting: Internal Medicine

## 2014-01-28 ENCOUNTER — Ambulatory Visit: Payer: BC Managed Care – PPO | Admitting: General Surgery

## 2014-02-05 ENCOUNTER — Ambulatory Visit (INDEPENDENT_AMBULATORY_CARE_PROVIDER_SITE_OTHER): Payer: BC Managed Care – PPO | Admitting: General Surgery

## 2014-02-05 ENCOUNTER — Encounter: Payer: Self-pay | Admitting: General Surgery

## 2014-02-05 VITALS — BP 122/82 | HR 88 | Resp 12 | Ht 65.5 in | Wt 139.0 lb

## 2014-02-05 DIAGNOSIS — C50219 Malignant neoplasm of upper-inner quadrant of unspecified female breast: Secondary | ICD-10-CM

## 2014-02-05 DIAGNOSIS — C50211 Malignant neoplasm of upper-inner quadrant of right female breast: Secondary | ICD-10-CM

## 2014-02-05 HISTORY — PX: PORT-A-CATH REMOVAL: SHX5289

## 2014-02-05 NOTE — Patient Instructions (Signed)
Ice pack on and off for today May shower Remove dressing in 3-4 days

## 2014-02-05 NOTE — Progress Notes (Signed)
Patient ID: Karen Washington, female   DOB: 1955-10-09, 58 y.o.   MRN: [id number]  Chief Complaint  Patient presents with  . Procedure    port removal    HPI Karen Washington is a 58 y.o. female.  Here today for port removal. No new complaints, she does admit to sinus congestion with the weather changing.   HPI  Past Medical History  Diagnosis Date  . Shingles 2014  . 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     Past Surgical History  Procedure Laterality Date  . Breast surgery Right December 13 2012    lumpectomy  . Portacath placement  2014    Family History  Problem Relation Age of Onset  . Cancer Mother     uterine   . Cancer Maternal Grandmother     breast  . Cancer Father     prostate    Social History History  Substance Use Topics  . Smoking status: Never Smoker   . Smokeless tobacco: Never Used  . Alcohol Use: Yes    No Known Allergies  Current Outpatient Prescriptions  Medication Sig Dispense Refill  . ALPRAZolam (XANAX) 0.25 MG tablet Take 1 tablet (0.25 mg total) by mouth 2 (two) times daily as needed for sleep or anxiety.  20 tablet  0  . calcium carbonate (OS-CAL) 600 MG TABS Take 600 mg by mouth 2 (two) times daily with a meal.      . cholecalciferol (VITAMIN D) 1000 UNITS tablet Take 1,000 Units by mouth daily.      Marland Kitchen ibuprofen (ADVIL,MOTRIN) 200 MG tablet Take 200 mg by mouth every 6 (six) hours as needed for pain.      . Multiple Vitamins-Minerals ([employer] SPECTRAVITE ULTRA WOMEN PO) Take by mouth daily.       No current facility-administered medications for this visit.    Review of Systems Review of Systems  Constitutional: Negative.   Respiratory: Negative.   Cardiovascular: Negative.     Blood pressure 122/82, pulse 88, resp. rate 12, height 5' 5.5" (1.664 m), weight 139 lb (63.05 kg).  Physical Exam Physical Exam Examination of the left  chest wall shows no evidence of inflammation or induration.    Assessment    Candidate for power port removal.    Plan    The chest was prepped with alcohol followed by 10 cc of 0.5% Xylocaine with 0.25% Marcaine with 1 200,000 units epinephrine. ChloraPrep was applied to the skin. The prior incision was opened and the port exposed. Transfixion sutures were removed. The port was removed with an intact tip. No bleeding. Adipose layer was approximated with a running 3-0 Vicryl, skin approximated with a running 3-0 subcuticular suture. Benzoin and Steri-Strips followed by Telfa and Tegaderm dressing. Ice pack applied. Postbiopsy instructions reviewed.  The patient will return for wound evaluation with the saphenofemoral week.     PCP: Crecencio Mc  CSN: [id number]   Robert Bellow 02/06/2014, 11:03 AM

## 2014-02-11 ENCOUNTER — Ambulatory Visit (INDEPENDENT_AMBULATORY_CARE_PROVIDER_SITE_OTHER): Payer: Self-pay | Admitting: *Deleted

## 2014-02-11 DIAGNOSIS — C50219 Malignant neoplasm of upper-inner quadrant of unspecified female breast: Secondary | ICD-10-CM

## 2014-02-11 DIAGNOSIS — C50211 Malignant neoplasm of upper-inner quadrant of right female breast: Secondary | ICD-10-CM

## 2014-02-11 NOTE — Progress Notes (Signed)
Patient came in today for a wound check port removal .  The wound is clean, with no signs of infection noted. Follow up as scheduled.

## 2014-02-13 ENCOUNTER — Ambulatory Visit: Payer: Self-pay | Admitting: Internal Medicine

## 2014-03-16 HISTORY — PX: MASTOPEXY: SUR857

## 2014-03-17 ENCOUNTER — Encounter: Payer: Self-pay | Admitting: General Surgery

## 2014-04-08 ENCOUNTER — Ambulatory Visit: Payer: Self-pay

## 2014-04-21 ENCOUNTER — Ambulatory Visit (INDEPENDENT_AMBULATORY_CARE_PROVIDER_SITE_OTHER): Payer: BC Managed Care – PPO | Admitting: Internal Medicine

## 2014-04-21 ENCOUNTER — Other Ambulatory Visit (HOSPITAL_COMMUNITY)
Admission: RE | Admit: 2014-04-21 | Discharge: 2014-04-21 | Disposition: A | Discharge status: Home / Self Care | Payer: BC Managed Care – PPO | Source: Ambulatory Visit | Attending: Internal Medicine | Admitting: Internal Medicine

## 2014-04-21 ENCOUNTER — Encounter: Payer: Self-pay | Admitting: Internal Medicine

## 2014-04-21 ENCOUNTER — Ambulatory Visit (INDEPENDENT_AMBULATORY_CARE_PROVIDER_SITE_OTHER): Payer: BC Managed Care – PPO | Admitting: *Deleted

## 2014-04-21 VITALS — BP 118/80 | HR 70 | Temp 97.5°F | Resp 16 | Ht 65.5 in | Wt 141.5 lb

## 2014-04-21 DIAGNOSIS — Z01419 Encounter for gynecological examination (general) (routine) without abnormal findings: Secondary | ICD-10-CM | POA: Insufficient documentation

## 2014-04-21 DIAGNOSIS — Z23 Encounter for immunization: Secondary | ICD-10-CM

## 2014-04-21 DIAGNOSIS — C50211 Malignant neoplasm of upper-inner quadrant of right female breast: Secondary | ICD-10-CM

## 2014-04-21 DIAGNOSIS — Z0000 Encounter for general adult medical examination without abnormal findings: Secondary | ICD-10-CM

## 2014-04-21 DIAGNOSIS — Z1151 Encounter for screening for human papillomavirus (HPV): Secondary | ICD-10-CM | POA: Diagnosis present

## 2014-04-21 DIAGNOSIS — N952 Postmenopausal atrophic vaginitis: Secondary | ICD-10-CM

## 2014-04-21 DIAGNOSIS — Z124 Encounter for screening for malignant neoplasm of cervix: Secondary | ICD-10-CM

## 2014-04-21 DIAGNOSIS — IMO0002 Reserved for concepts with insufficient information to code with codable children: Secondary | ICD-10-CM

## 2014-04-21 DIAGNOSIS — Z7189 Other specified counseling: Secondary | ICD-10-CM

## 2014-04-21 DIAGNOSIS — Z7184 Encounter for health counseling related to travel: Secondary | ICD-10-CM

## 2014-04-21 MED ORDER — ALPRAZOLAM 0.25 MG PO TABS: 0.2500 mg | ORAL_TABLET | Freq: Two times a day (BID) | ORAL | Status: DC | PRN

## 2014-04-21 NOTE — Progress Notes (Signed)
Pre-visit discussion using our clinic review tool. No additional management support is needed unless otherwise documented below in the visit note.  

## 2014-04-21 NOTE — Patient Instructions (Addendum)
You had your annual  wellness exam today.  We will repeat your PAP smear in 2018  You received the TDaP and the influenza vaccines today.  If you develop a sore arm , add tylenol  To the Aleve you are already taking for a few days   I am referring you to the [facility] travel clinic for your upcoming trip to [place] to make sure we have the right vaccines done  I recommend getting the majority of your calcium  through diet rather than supplements given the recent association of calcium supplements with increased coronary artery calcium scores (You need 1200 mg daily )   Unsweetened almond/coconut milk is a great low calorie low carb, cholesterol free  way to increase your dietary calcium and vitamin D.  Try the blue Jackquline Bosch.  One 8 ounce glass has 430 mg calcium  You should take 1000 to 2000 units of D3 daily during the winter months (throught diet or supplements)   Health Maintenance Adopting a healthy lifestyle and getting preventive care can go a long way to promote health and wellness. Talk with your health care provider about what schedule of regular examinations is right for you. This is a good chance for you to check in with your provider about disease prevention and staying healthy. In between checkups, there are plenty of things you can do on your own. Experts have done a lot of research about which lifestyle changes and preventive measures are most likely to keep you healthy. Ask your health care provider for more information. WEIGHT AND DIET  Eat a healthy diet  Be sure to include plenty of vegetables, fruits, low-fat dairy products, and lean protein.  Do not eat a lot of foods high in solid fats, added sugars, or salt.  Get regular exercise. This is one of the most important things you can do for your health.  Most adults should exercise for at least 150 minutes each week. The exercise should increase your heart rate and make you sweat (moderate-intensity  exercise).  Most adults should also do strengthening exercises at least twice a week. This is in addition to the moderate-intensity exercise.  Maintain a healthy weight  Body mass index (BMI) is a measurement that can be used to identify possible weight problems. It estimates body fat based on height and weight. Your health care provider can help determine your BMI and help you achieve or maintain a healthy weight.  For females 78 years of age and older:   A BMI below 18.5 is considered underweight.  A BMI of 18.5 to 24.9 is normal.  A BMI of 25 to 29.9 is considered overweight.  A BMI of 30 and above is considered obese.  Watch levels of cholesterol and blood lipids  You should start having your blood tested for lipids and cholesterol at 56 years of age, then have this test every 5 years.  You may need to have your cholesterol levels checked more often if:  Your lipid or cholesterol levels are high.  You are older than 56 years of age.  You are at high risk for heart disease.  CANCER SCREENING   Lung Cancer  Lung cancer screening is recommended for adults 34-73 years old who are at high risk for lung cancer because of a history of smoking.  A yearly low-dose CT scan of the lungs is recommended for people who:  Currently smoke.  Have quit within the past 15 years.  Have  at least a 30-pack-year history of smoking. A pack year is smoking an average of one pack of cigarettes a day for 1 year.  Yearly screening should continue until it has been 15 years since you quit.  Yearly screening should stop if you develop a health problem that would prevent you from having lung cancer treatment.  Breast Cancer  Practice breast self-awareness. This means understanding how your breasts normally appear and feel.  It also means doing regular breast self-exams. Let your health care provider know about any changes, no matter how small.  If you are in your 20s or 30s, you should  have a clinical breast exam (CBE) by a health care provider every 1-3 years as part of a regular health exam.  If you are 5 or older, have a CBE every year. Also consider having a breast X-ray (mammogram) every year.  If you have a family history of breast cancer, talk to your health care provider about genetic screening.  If you are at high risk for breast cancer, talk to your health care provider about having an MRI and a mammogram every year.  Breast cancer gene (BRCA) assessment is recommended for women who have family members with BRCA-related cancers. BRCA-related cancers include:  Breast.  Ovarian.  Tubal.  Peritoneal cancers.  Results of the assessment will determine the need for genetic counseling and BRCA1 and BRCA2 testing. Cervical Cancer Routine pelvic examinations to screen for cervical cancer are no longer recommended for nonpregnant women who are considered low risk for cancer of the pelvic organs (ovaries, uterus, and vagina) and who do not have symptoms. A pelvic examination may be necessary if you have symptoms including those associated with pelvic infections. Ask your health care provider if a screening pelvic exam is right for you.   The Pap test is the screening test for cervical cancer for women who are considered at risk.  If you had a hysterectomy for a problem that was not cancer or a condition that could lead to cancer, then you no longer need Pap tests.  If you are older than 65 years, and you have had normal Pap tests for the past 10 years, you no longer need to have Pap tests.  If you have had past treatment for cervical cancer or a condition that could lead to cancer, you need Pap tests and screening for cancer for at least 20 years after your treatment.  If you no longer get a Pap test, assess your risk factors if they change (such as having a new sexual partner). This can affect whether you should start being screened again.  Some women have medical  problems that increase their chance of getting cervical cancer. If this is the case for you, your health care provider may recommend more frequent screening and Pap tests.  The human papillomavirus (HPV) test is another test that may be used for cervical cancer screening. The HPV test looks for the virus that can cause cell changes in the cervix. The cells collected during the Pap test can be tested for HPV.  The HPV test can be used to screen women 12 years of age and older. Getting tested for HPV can extend the interval between normal Pap tests from three to five years.  An HPV test also should be used to screen women of any age who have unclear Pap test results.  After 56 years of age, women should have HPV testing as often as Pap tests.  Colorectal Cancer  This type of cancer can be detected and often prevented.  Routine colorectal cancer screening usually begins at 56 years of age and continues through 56 years of age.  Your health care provider may recommend screening at an earlier age if you have risk factors for colon cancer.  Your health care provider may also recommend using home test kits to check for hidden blood in the stool.  A small camera at the end of a tube can be used to examine your colon directly (sigmoidoscopy or colonoscopy). This is done to check for the earliest forms of colorectal cancer.  Routine screening usually begins at age 60.  Direct examination of the colon should be repeated every 5-10 years through 56 years of age. However, you may need to be screened more often if early forms of precancerous polyps or small growths are found. Skin Cancer  Check your skin from head to toe regularly.  Tell your health care provider about any new moles or changes in moles, especially if there is a change in a mole's shape or color.  Also tell your health care provider if you have a mole that is larger than the size of a pencil eraser.  Always use sunscreen. Apply  sunscreen liberally and repeatedly throughout the day.  Protect yourself by wearing long sleeves, pants, a wide-brimmed hat, and sunglasses whenever you are outside. HEART DISEASE, DIABETES, AND HIGH BLOOD PRESSURE   Have your blood pressure checked at least every 1-2 years. High blood pressure causes heart disease and increases the risk of stroke.  If you are between 79 years and 61 years old, ask your health care provider if you should take aspirin to prevent strokes.  Have regular diabetes screenings. This involves taking a blood sample to check your fasting blood sugar level.  If you are at a normal weight and have a low risk for diabetes, have this test once every three years after 57 years of age.  If you are overweight and have a high risk for diabetes, consider being tested at a younger age or more often. PREVENTING INFECTION  Hepatitis B  If you have a higher risk for hepatitis B, you should be screened for this virus. You are considered at high risk for hepatitis B if:  You were born in a country where hepatitis B is common. Ask your health care provider which countries are considered high risk.  Your parents were born in a high-risk country, and you have not been immunized against hepatitis B (hepatitis B vaccine).  You have HIV or AIDS.  You use needles to inject street drugs.  You live with someone who has hepatitis B.  You have had sex with someone who has hepatitis B.  You get hemodialysis treatment.  You take certain medicines for conditions, including cancer, organ transplantation, and autoimmune conditions. Hepatitis C  Blood testing is recommended for:  Everyone born from [birth year] through 1963.  Anyone with known risk factors for hepatitis C. Sexually transmitted infections (STIs)  You should be screened for sexually transmitted infections (STIs) including gonorrhea and chlamydia if:  You are sexually active and are younger than 56 years of age.  You are  older than 56 years of age and your health care provider tells you that you are at risk for this type of infection.  Your sexual activity has changed since you were last screened and you are at an increased risk for chlamydia or gonorrhea. Ask your health care provider if you are  at risk.  If you do not have HIV, but are at risk, it may be recommended that you take a prescription medicine daily to prevent HIV infection. This is called pre-exposure prophylaxis (PrEP). You are considered at risk if:  You are sexually active and do not regularly use condoms or know the HIV status of your partner(s).  You take drugs by injection.  You are sexually active with a partner who has HIV. Talk with your health care provider about whether you are at high risk of being infected with HIV. If you choose to begin PrEP, you should first be tested for HIV. You should then be tested every 3 months for as long as you are taking PrEP.  PREGNANCY   If you are premenopausal and you may become pregnant, ask your health care provider about preconception counseling.  If you may become pregnant, take 400 to 800 micrograms (mcg) of folic acid every day.  If you want to prevent pregnancy, talk to your health care provider about birth control (contraception). OSTEOPOROSIS AND MENOPAUSE   Osteoporosis is a disease in which the bones lose minerals and strength with aging. This can result in serious bone fractures. Your risk for osteoporosis can be identified using a bone density scan.  If you are 68 years of age or older, or if you are at risk for osteoporosis and fractures, ask your health care provider if you should be screened.  Ask your health care provider whether you should take a calcium or vitamin D supplement to lower your risk for osteoporosis.  Menopause may have certain physical symptoms and risks.  Hormone replacement therapy may reduce some of these symptoms and risks. Talk to your health care provider  about whether hormone replacement therapy is right for you.  HOME CARE INSTRUCTIONS   Schedule regular health, dental, and eye exams.  Stay current with your immunizations.   Do not use any tobacco products including cigarettes, chewing tobacco, or electronic cigarettes.  If you are pregnant, do not drink alcohol.  If you are breastfeeding, limit how much and how often you drink alcohol.  Limit alcohol intake to no more than 1 drink per day for nonpregnant women. One drink equals 12 ounces of beer, 5 ounces of wine, or 1 ounces of hard liquor.  Do not use street drugs.  Do not share needles.  Ask your health care provider for help if you need support or information about quitting drugs.  Tell your health care provider if you often feel depressed.  Tell your health care provider if you have ever been abused or do not feel safe at home. Document Released: 11/15/2010 Document Revised: 09/16/2013 Document Reviewed: 04/03/2013 [facility] Patient Information 2015 [place], [place]. This information is not intended to replace advice given to you by your health care provider. Make sure you discuss any questions you have with your health care provider.

## 2014-04-21 NOTE — Progress Notes (Signed)
Patient ID: Karen Washington, female   DOB: 19-May-1955, 58 y.o.   MRN: [id number]  Subjective:     Karen Washington is a 58 y.o. female and is here for a comprehensive physical exam. The patient reports minor problems as dicussed below:.  History   Social History  . Marital Status: Married    Spouse Name: N/A    Number of Children: N/A  . Years of Education: N/A   Occupational History  . Not on file.   Social History Main Topics  . Smoking status: Never Smoker   . Smokeless tobacco: Never Used  . Alcohol Use: Yes  . Drug Use: No  . Sexual Activity: Not on file   Other Topics Concern  . Not on file   Social History Narrative   Health Maintenance  Topic Date Due  . PAP SMEAR  11/09/2014  . INFLUENZA VACCINE  12/15/2014  . MAMMOGRAM  12/18/2015  . COLONOSCOPY  11/09/2019  . TETANUS/TDAP  04/21/2024    The following portions of the patient's history were reviewed and updated as appropriate: allergies, current medications, past family history, past medical history, past social history, past surgical history and problem list.  Review of Systems A comprehensive review of systems was negative.   Objective:   BP 118/80 mmHg  Pulse 70  Temp(Src) 97.5 F (36.4 C) (Oral)  Resp 16  Ht 5' 5.5" (1.664 m)  Wt 141 lb 8 oz (64.184 kg)  BMI 23.18 kg/m2  SpO2 98%  BP 118/80 mmHg  Pulse 70  Temp(Src) 97.5 F (36.4 C) (Oral)  Resp 16  Ht 5' 5.5" (1.664 m)  Wt 141 lb 8 oz (64.184 kg)  BMI 23.18 kg/m2  SpO2 98%  General Appearance:    Alert, cooperative, no distress, appears stated age  Head:    Normocephalic, without obvious abnormality, atraumatic  Eyes:    PERRL, conjunctiva/corneas clear, EOM's intact, fundi    benign, both eyes  Ears:    Normal TM's and external ear canals, both ears  Nose:   Nares normal, septum midline, mucosa normal, no drainage    or sinus tenderness  Throat:   Lips, mucosa, and tongue normal; teeth and gums normal  Neck:   Supple, symmetrical, trachea  midline, no adenopathy;    thyroid:  no enlargement/tenderness/nodules; no carotid   bruit or JVD  Back:     Symmetric, no curvature, ROM normal, no CVA tenderness  Lungs:     Clear to auscultation bilaterally, respirations unlabored  Chest Wall:    No tenderness or deformity   Heart:    Regular rate and rhythm, S1 and S2 normal, no murmur, rub   or gallop  Breast Exam:    Deferred given recent exam by Dr. Bary Castilla and recent surgical procedure  Abdomen:     Soft, non-tender, bowel sounds active all four quadrants,    no masses, no organomegaly  Genitalia:    Normal female without lesion, discharge or tenderness  Rectal:  Deferred  Extremities:   Extremities normal, atraumatic, no cyanosis or edema  Pulses:   2+ and symmetric all extremities  Skin:   Skin color, texture, turgor normal, no rashes or lesions  Lymph nodes:   Cervical, supraclavicular, and axillary nodes normal  Neurologic:   CNII-XII intact, normal strength, sensation and reflexes    throughout   Assessment and Plan:   Breast cancer of upper-inner quadrant of right female breast Diagnosed July 2014, s/p wide excision, mastoplasty and SLN.  She has completed chemotherapy, XRT and adjuvant chemotherapy with Heerceptin.  Power port has been removed.  Continue q 6 month follow up with Dr Bary Castilla    Postmenopausal atrophic vaginitis Chief conmplaint is dryness,  Exam is normal .  Vaginal estrogen discussed but given history of BRCA not advised  Astroglide trial   Advice or immunization for travel It is unclear which vaccinations she will need ,  So I have referred her to the Travel clinic for her upcoming trip to [place].  Influenza vaccine offered but declined due to preior adverse reaction,  TDap given.   Encounter for preventive health examination Annual wellness  exam was done as well as a comprehensive physical exam and management of acute and chronic conditions .  During the course of the visit the patient was  educated and counseled about appropriate screening and preventive services including :  diabetes screening, lipid analysis with projected  10 year  risk for CAD , nutrition counseling, colorectal cancer screening, and recommended immunizations.  Printed recommendations for health maintenance screenings was given.    Updated Medication List Outpatient Encounter Prescriptions as of 04/21/2014  Medication Sig  . ALPRAZolam (XANAX) 0.25 MG tablet Take 1 tablet (0.25 mg total) by mouth 2 (two) times daily as needed for sleep or anxiety.  . calcium carbonate (OS-CAL) 600 MG TABS Take 600 mg by mouth 2 (two) times daily with a meal.  . cholecalciferol (VITAMIN D) 1000 UNITS tablet Take 1,000 Units by mouth daily.  Marland Kitchen ibuprofen (ADVIL,MOTRIN) 200 MG tablet Take 200 mg by mouth every 6 (six) hours as needed for pain.  . Multiple Vitamins-Minerals ([employer] SPECTRAVITE ULTRA WOMEN PO) Take by mouth daily.  . [DISCONTINUED] ALPRAZolam (XANAX) 0.25 MG tablet Take 1 tablet (0.25 mg total) by mouth 2 (two) times daily as needed for sleep or anxiety.

## 2014-04-22 DIAGNOSIS — N952 Postmenopausal atrophic vaginitis: Secondary | ICD-10-CM | POA: Insufficient documentation

## 2014-04-22 DIAGNOSIS — IMO0002 Reserved for concepts with insufficient information to code with codable children: Secondary | ICD-10-CM | POA: Insufficient documentation

## 2014-04-22 DIAGNOSIS — Z0000 Encounter for general adult medical examination without abnormal findings: Secondary | ICD-10-CM | POA: Insufficient documentation

## 2014-04-22 NOTE — Addendum Note (Signed)
Addended by: Crecencio Mc on: 04/22/2014 12:11 PM   Modules accepted: Level of Service

## 2014-04-22 NOTE — Assessment & Plan Note (Addendum)
It is unclear which vaccinations she will need ,  So I have referred her to the Travel clinic for her upcoming trip to [place].  Influenza vaccine offered but declined due to preior adverse reaction,  TDap given.

## 2014-04-22 NOTE — Assessment & Plan Note (Signed)

## 2014-04-22 NOTE — Assessment & Plan Note (Signed)
Chief conmplaint is dryness,  Exam is normal .  Vaginal estrogen discussed but given history of BRCA not advised  Astroglide trial

## 2014-04-22 NOTE — Assessment & Plan Note (Addendum)
Diagnosed July 2014, s/p wide excision, mastoplasty and SLN.  She has completed chemotherapy, XRT and adjuvant chemotherapy with Heerceptin.  Power port has been removed.  Continue q 6 month follow up with Dr Bary Castilla

## 2014-04-23 ENCOUNTER — Encounter: Payer: Self-pay | Admitting: Internal Medicine

## 2014-04-23 LAB — CYTOLOGY - PAP

## 2014-04-28 ENCOUNTER — Ambulatory Visit: Payer: Self-pay | Admitting: Internal Medicine

## 2014-04-28 LAB — CBC CANCER CENTER
BASOS PCT: 1.1 %
Basophil #: 0.1 x10 3/mm (ref 0.0–0.1)
EOS PCT: 3.2 %
Eosinophil #: 0.2 x10 3/mm (ref 0.0–0.7)
HCT: 42.5 % (ref 35.0–47.0)
HGB: 14.2 g/dL (ref 12.0–16.0)
Lymphocyte #: 1.7 x10 3/mm (ref 1.0–3.6)
Lymphocyte %: 25.4 %
MCH: 32 pg (ref 26.0–34.0)
MCHC: 33.3 g/dL (ref 32.0–36.0)
MCV: 96 fL (ref 80–100)
MONO ABS: 0.7 x10 3/mm (ref 0.2–0.9)
MONOS PCT: 10.1 %
NEUTROS ABS: 3.9 x10 3/mm (ref 1.4–6.5)
Neutrophil %: 60.2 %
Platelet: 245 x10 3/mm (ref 150–440)
RBC: 4.43 10*6/uL (ref 3.80–5.20)
RDW: 12.2 % (ref 11.5–14.5)
WBC: 6.5 x10 3/mm (ref 3.6–11.0)

## 2014-04-28 LAB — HEPATIC FUNCTION PANEL A (ARMC)
ALT: 37 U/L
Albumin: 4.1 g/dL (ref 3.4–5.0)
Alkaline Phosphatase: 92 U/L
BILIRUBIN DIRECT: 0.1 mg/dL (ref 0.0–0.2)
Bilirubin,Total: 0.2 mg/dL (ref 0.2–1.0)
SGOT(AST): 18 U/L (ref 15–37)
Total Protein: 7.3 g/dL (ref 6.4–8.2)

## 2014-04-28 LAB — CREATININE, SERUM
Creatinine: 0.81 mg/dL (ref 0.60–1.30)
EGFR (African American): >60

## 2014-04-29 LAB — CANCER ANTIGEN 27.29: CA 27.29: 17.4 U/mL (ref 0.0–38.6)

## 2014-05-16 ENCOUNTER — Ambulatory Visit: Payer: Self-pay | Admitting: Internal Medicine

## 2014-06-24 ENCOUNTER — Encounter: Payer: Self-pay | Admitting: General Surgery

## 2014-06-24 ENCOUNTER — Ambulatory Visit (INDEPENDENT_AMBULATORY_CARE_PROVIDER_SITE_OTHER): Payer: BC Managed Care – PPO | Admitting: General Surgery

## 2014-06-24 ENCOUNTER — Other Ambulatory Visit: Payer: BC Managed Care – PPO

## 2014-06-24 VITALS — BP 106/68 | HR 80 | Resp 12 | Ht 66.0 in | Wt 143.0 lb

## 2014-06-24 DIAGNOSIS — D241 Benign neoplasm of right breast: Secondary | ICD-10-CM

## 2014-06-24 DIAGNOSIS — C50211 Malignant neoplasm of upper-inner quadrant of right female breast: Secondary | ICD-10-CM

## 2014-06-24 NOTE — Progress Notes (Addendum)
Patient ID: Karen Washington, female   DOB: 15-Dec-1955, 59 y.o.   MRN: [id number]  Chief Complaint  Patient presents with  . Follow-up    ultrasound    HPI Karen Washington is a 59 y.o. female here today for a right breast ultrasound. She did have left breast mastopexy in Nov 2015 with Dr. Tula Nakayama. No new breast issues. Her last visit with Dr. Ma Hillock was in Dec 2015.  HPI  Past Medical History  Diagnosis Date  . Shingles 2014  . 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     Past Surgical History  Procedure Laterality Date  . Breast surgery Right December 13 2012    lumpectomy  . Portacath placement  2014  . Mastopexy Left Nov 2015    Dr Tula Nakayama  . Port-a-cath removal  02-05-14    Dr Bary Castilla    Family History  Problem Relation Age of Onset  . Cancer Mother     uterine   . Cancer Maternal Grandmother     breast  . Cancer Father     prostate    Social History History  Substance Use Topics  . Smoking status: Never Smoker   . Smokeless tobacco: Never Used  . Alcohol Use: Yes    No Known Allergies  Current Outpatient Prescriptions  Medication Sig Dispense Refill  . ALPRAZolam (XANAX) 0.25 MG tablet Take 1 tablet (0.25 mg total) by mouth 2 (two) times daily as needed for sleep or anxiety. 20 tablet 0  . calcium carbonate (OS-CAL) 600 MG TABS Take 600 mg by mouth 2 (two) times daily with a meal.    . cholecalciferol (VITAMIN D) 1000 UNITS tablet Take 1,000 Units by mouth daily.    Marland Kitchen ibuprofen (ADVIL,MOTRIN) 200 MG tablet Take 200 mg by mouth every 6 (six) hours as needed for pain.    . Multiple Vitamins-Minerals ([employer] SPECTRAVITE ULTRA WOMEN PO) Take by mouth daily.     No current facility-administered medications for this visit.    Review of Systems Review of Systems  Constitutional: Negative.   Respiratory: Negative.   Cardiovascular: Negative.     Blood pressure 106/68,  pulse 80, resp. rate 12, height 5\' 6"  (1.676 m), weight 143 lb (64.864 kg).  Physical Exam Physical Exam  Constitutional: She is oriented to person, place, and time. She appears well-developed and well-nourished.  Neck: Neck supple.  Cardiovascular: Normal rate, regular rhythm and normal heart sounds.   Pulmonary/Chest: Effort normal and breath sounds normal. Right breast exhibits no inverted nipple, no mass, no nipple discharge, no skin change and no tenderness. Left breast exhibits no inverted nipple, no mass, no nipple discharge, no skin change and no tenderness.    Bra 34 C. Well healed left breast incision. Right breast with mild volume loss.  Lymphadenopathy:    She has no cervical adenopathy.    She has no axillary adenopathy.  Neurological: She is alert and oriented to person, place, and time.  Skin: Skin is warm and dry.    Data Reviewed Repeat examination of the right breast in the 11:00 position at the site of the previously identified nodule shows that this is 00.6 x 0.68 x 0.73 cm. This is located 6 cm from the nipple. Unchanged from past exams. BI-RADS-2.  Assessment    Doing well now over 18 months status post breast conservation and  subsequent contralateral breast mastopexy.    Plan    Follow up examination in 6 months.    The patient has been asked to return to the office in 6 months with a bilateral diagnostic mammogram.  PCP:  Deborra Medina  Dr Ma Hillock Dr Tula Nakayama  Robert Bellow 06/25/2014, 4:30 PM

## 2014-06-24 NOTE — Patient Instructions (Signed)
,  Continue self breast exams. Call office for any new breast issues or concerns. 

## 2014-06-25 DIAGNOSIS — D249 Benign neoplasm of unspecified breast: Secondary | ICD-10-CM | POA: Insufficient documentation

## 2014-08-01 ENCOUNTER — Ambulatory Visit
Admit: 2014-08-01 | Disposition: A | Discharge status: Home / Self Care | Payer: Self-pay | Attending: Internal Medicine | Admitting: Internal Medicine

## 2014-08-15 ENCOUNTER — Ambulatory Visit
Admit: 2014-08-15 | Disposition: A | Discharge status: Home / Self Care | Payer: Self-pay | Attending: Internal Medicine | Admitting: Internal Medicine

## 2014-08-29 ENCOUNTER — Telehealth: Payer: Self-pay | Admitting: General Surgery

## 2014-08-29 LAB — CBC CANCER CENTER
BASOS ABS: 0 x10 3/mm (ref 0.0–0.1)
Basophil %: 0.9 %
EOS ABS: 0.1 x10 3/mm (ref 0.0–0.7)
Eosinophil %: 1.4 %
HCT: 43.7 % (ref 35.0–47.0)
HGB: 14.5 g/dL (ref 12.0–16.0)
Lymphocyte #: 1.2 x10 3/mm (ref 1.0–3.6)
Lymphocyte %: 22.9 %
MCH: 31.7 pg (ref 26.0–34.0)
MCHC: 33.2 g/dL (ref 32.0–36.0)
MCV: 96 fL (ref 80–100)
MONO ABS: 0.4 x10 3/mm (ref 0.2–0.9)
Monocyte %: 8 %
Neutrophil #: 3.4 x10 3/mm (ref 1.4–6.5)
Neutrophil %: 66.8 %
PLATELETS: 253 x10 3/mm (ref 150–440)
RBC: 4.56 10*6/uL (ref 3.80–5.20)
RDW: 12.9 % (ref 11.5–14.5)
WBC: 5 x10 3/mm (ref 3.6–11.0)

## 2014-08-29 LAB — HEPATIC FUNCTION PANEL A (ARMC)
ALK PHOS: 69 U/L
AST: 24 U/L
Albumin: 4.5 g/dL
BILIRUBIN TOTAL: 0.5 mg/dL
Bilirubin, Direct: <0.1 mg/dL
SGPT (ALT): 20 U/L
Total Protein: 7.3 g/dL

## 2014-08-29 LAB — CREATININE, SERUM
Creatinine: 0.87 mg/dL
EGFR (African American): >60
EGFR (Non-African Amer.): >60

## 2014-08-29 NOTE — Telephone Encounter (Signed)
I CALLED PT & L/M DR BYRNETT WANTED TO SEE HER ON 09-04-14 @ 11:45AM.BUT TO CALL TO CONFIRM OR IF NOT CONVENIENT WE CAN R/S JUST NOT ON 09-02-14

## 2014-08-30 LAB — CANCER ANTIGEN 27.29: CA 27.29: 16.5 U/mL (ref 0.0–38.6)

## 2014-09-04 ENCOUNTER — Encounter: Payer: Self-pay | Admitting: General Surgery

## 2014-09-04 ENCOUNTER — Ambulatory Visit (INDEPENDENT_AMBULATORY_CARE_PROVIDER_SITE_OTHER): Payer: BC Managed Care – PPO | Admitting: General Surgery

## 2014-09-04 ENCOUNTER — Other Ambulatory Visit: Payer: BC Managed Care – PPO

## 2014-09-04 VITALS — BP 120/70 | HR 72 | Resp 12 | Ht 66.0 in | Wt 137.0 lb

## 2014-09-04 DIAGNOSIS — R591 Generalized enlarged lymph nodes: Secondary | ICD-10-CM

## 2014-09-04 DIAGNOSIS — C50211 Malignant neoplasm of upper-inner quadrant of right female breast: Secondary | ICD-10-CM

## 2014-09-04 NOTE — Progress Notes (Signed)
Patient ID: Karen Washington, female   DOB: [birth year], 59 y.o.   MRN: [id number]  Chief Complaint  Patient presents with  . Follow-up    HPI Karen Washington is a 59 y.o. female.  Here for evaluation of a recently identified left supraclavicular, lower neck lymph node and possible left axilla node found by Dr. Ma Hillock last week during her follow-up visit regarding her right breast cancer..  Denies weight loss or fatigue. She does admit to having a 39 one 57 old golden retriever puppy and possibly has been scratched during play time. Denies any upper respiratory/sinus infections or cough.  She did travel to [place] in January.  HPI  Past Medical History  Diagnosis Date  . Shingles 2014  . 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     Past Surgical History  Procedure Laterality Date  . Breast surgery Right December 13 2012    lumpectomy  . Portacath placement  2014  . Mastopexy Left Nov 2015    Dr Tula Nakayama  . Port-a-cath removal  02-05-14    Dr Bary Castilla    Family History  Problem Relation Age of Onset  . Cancer Mother     uterine   . Cancer Maternal Grandmother     breast  . Cancer Father     prostate    Social History History  Substance Use Topics  . Smoking status: Never Smoker   . Smokeless tobacco: Never Used  . Alcohol Use: Yes    No Known Allergies  Current Outpatient Prescriptions  Medication Sig Dispense Refill  . ALPRAZolam (XANAX) 0.25 MG tablet Take 1 tablet (0.25 mg total) by mouth 2 (two) times daily as needed for sleep or anxiety. 20 tablet 0  . calcium carbonate (OS-CAL) 600 MG TABS Take 600 mg by mouth 2 (two) times daily with a meal.    . cholecalciferol (VITAMIN D) 1000 UNITS tablet Take 1,000 Units by mouth daily.    Marland Kitchen ibuprofen (ADVIL,MOTRIN) 200 MG tablet Take 200 mg by mouth every 6 (six) hours as needed for pain.    . Multiple  Vitamins-Minerals ([employer] SPECTRAVITE ULTRA WOMEN PO) Take by mouth daily.     No current facility-administered medications for this visit.    Review of Systems Review of Systems  Constitutional: Negative.   Respiratory: Negative.   Cardiovascular: Negative.     Blood pressure 120/70, pulse 72, resp. rate 12, height 5\' 6"  (1.676 m), weight 137 lb (62.143 kg).  Physical Exam Physical Exam  Constitutional: She is oriented to person, place, and time. She appears well-developed and well-nourished.  Neck: Neck supple.    Cardiovascular: Normal rate, regular rhythm and normal heart sounds.   Pulmonary/Chest: Effort normal and breath sounds normal.    Lymphadenopathy:       Left: Supraclavicular adenopathy present.  Left supraclavicular lymph node. 1 cm axillary node present.  Neurological: She is alert and oriented to person, place, and time.  Skin: Skin is warm and dry.    Data Reviewed Medical oncology notes of 09/01/2014.  Ultrasound examination of the axilla showed a 0.4 x 0.4 x 0.6 cm lymph node with normal echo architecture.  Examination of the superficial structures in the left neck showed a 0.4 x 0.5 x 0.6 cm normal-appearing lymph node to account for the palpable findings. This is immediately adjacent to the external  jugular vein.  Assessment    Benign lymphadenopathy, likely related to subclinical inflammatory process involving the upper extremity.    Plan    Observation alone is warranted at this time. The patient has been discouraged from manipulating the areas, but is encouraged to check monthly and to report should she develop increased size or develop any local symptoms. The likelihood of contralateral metastatic disease to 2 different nodal basins is exceptionally unlikely.    Follow up as scheduled in August 2016.    PCP: Deborra Medina  Dr Ma Hillock Dr Tula Nakayama  Robert Bellow 09/04/2014, 9:23 PM

## 2014-09-04 NOTE — Patient Instructions (Signed)
The patient is aware to call back for any questions or concerns. Follow up as scheduled in August 2016.

## 2014-09-04 NOTE — Addendum Note (Signed)
Addended by: Robert Bellow on: 09/04/2014 09:30 PM   Modules accepted: Level of Service

## 2014-09-05 NOTE — Consult Note (Signed)
Reason for Visit: This 59 year old Female patient presents to the clinic for initial evaluation of  breast cancer .   Referred by Dr. Hervey Ard.  Diagnosis:  Chief Complaint/Diagnosis   59 year old female status post wide local excision and sentinel node biopsy for a stage I (T1 C. 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 She noticed a knot around that time in the right breast in the upper inner quadrant confirmed on mammogram as a BI-RADS for a period she underwent a needle biopsy which was positive for invasive mammary carcinoma. Went on to have a wide local excision and sentinel node biopsy. Tumor was 1.5 cm with margins clear. Ductal carcinoma in situ was present although margins with our also clear. 4 sentinel lymph nodes were negative for metastatic disease. Tumor was ER/PR negative HER-2/neu overexpressed. Tumor was overall grade 3. Based on adjuvant online treatment recommendations she is seen medical oncology and will undergo some adjuvant chemotherapy plus Herceptin. I've asked to evaluate the patient for radiation oncology recommendations. She is doing well. Healing well from her surgery.  Past Hx:    right breast cancer:    Shingles: Jul 2014   lumpectomy:    Tonsillectomy:    Dilation and Curretage:    right breast biopsy:   Past, Family and Social History:  Past Medical History positive   Infections shingles   Past Surgical History tonsillectomy, D&C, lumpectomy   Family History positive   Family History Comments mother with uterine cancer maternal grandmother with breast cancer    Social History noncontributory   Additional Past Medical and Surgical History accompanied by husband and nurse navigator today   Allergies:   No Known Allergies:   Home Meds:  Home Medications: Medication Instructions Status  dexamethasone 4 mg oral tablet Take 2 tablets (8 mg) orally twice daily x 3 days starting on the day before each dose of Taxotere chemotherapy (first dose of chemo is planned on 01/07/13) Active  Norco 325 mg-5 mg oral tablet 1 -2 tab(s) orally every 4 hours as nedded for pain Active  cholecalciferol 1000 intl units oral tablet 1 tab(s) orally once a day Active  valACYclovir 1022m 1 tab(s) orally 2 times a day Active  Os-Cal 600 milligram(s) orally 2 times a day Active  ibuprofen 200 mg oral tablet 1 tab(s) orally every 6 hours, As Needed Active  multivitamin 1 tab(s) orally once a day Active   Review of Systems:  General negative   Performance Status (ECOG) 0   Skin negative   Breast see HPI   Ophthalmologic negative   ENMT negative   Respiratory and Thorax negative   Cardiovascular negative   Gastrointestinal negative   Genitourinary negative   Musculoskeletal negative   Neurological negative   Psychiatric negative   Hematology/Lymphatics negative   Endocrine negative   Allergic/Immunologic negative   Nursing Notes:  Nursing Vital Signs and Chemo Nursing Nursing Notes: *CC Vital Signs Flowsheet:   05-Aug-14 09:07  Temp Temperature 97.4  Pulse Pulse 85  Respirations Respirations 18  SBP SBP 121  DBP DBP 82  Pain Scale (0-10)  0  Current Weight (kg) (kg) 61.5  Height (  cm) centimeters 167.6  BSA (m2) 1.6   Physical Exam:  General/Skin/HEENT:  General normal   Skin normal   Eyes normal   ENMT normal   Head and Neck normal   Additional PE well-developed thin female in NAD. Lungs are clear to A&P cardiac examination shows regular rate and rhythm. Right breast is a wide local excision scar which is healing well. Some  ecchymosis surrounding the scar site. No dominant mass or nodularity is noted in either breast into position examined. No axillary or supraclavicular adenopathy is appreciated. Abdomen is benign.   Breasts/Resp/CV/GI/GU:  Respiratory and Thorax normal   Cardiovascular normal   Gastrointestinal normal   Genitourinary normal   MS/Neuro/Psych/Lymph:  Musculoskeletal normal   Neurological normal   Lymphatics normal   LAB Results:  Laboratory Results: Pathology:  31-Jul-14 12:00   Pathology Report ========== TEST NAME ==========  ========= RESULTS =========  = REFERENCE RANGE =  PATHOLOGY REPORT  Pathology Report .                               [   Final Report         ]                   Material submitted:         Marland Kitchen PART A: SENTINEL NODE RIGHT BREAST PART B: SENTINEL NODE #2 RIGHT BREAST PART C: WIDE EXCISION RIGHT BREAST .                               [   Final Report         ]                   Pre-operative diagnosis:              . MAMMARY CARCINOMA. SENTINEL NODES RIGHT BREAST, WIDE EXCISION RIGHT BREAST .                               [   Final Report         ]                   Frozen section diagnosis:                                       . FSA1. SENTINEL LYMPH NODE, RIGHT BREAST, EXCISION:       - NO MACROMETASTASIS IN 2 SENTINEL LYMPH NODES (0/2).       - CALLED TO DR. BYRNETT AT 1:46 PM ON 12/13/12. . FSB1. SENTINEL LYMPH NODE #2, RIGHT BREAST, EXCISION:       - NO EVIDENCE OF MACROMETASTASIS IN 2 SENTINEL LYMPH         NODES (0/2).       - RESULTS AVAILABLE FOR DR. BYRNETT AT 2:07 PM (CALLED         AND PAGED), FINAL CALL AT 2:21 ON 12/13/12. Ronaldo Miyamoto. RIGHT BREAST, WIDE EXCISION:       - MASS LOCATED 0.3-0.4 CM FROM DEEP (PECTORALISFASCIA)         MARGIN.       - CALLED DR. BYRNETT AT 2:21 PM ON 12/13/12. Marland Kitchen  DR. Delorse Lek . Marland Kitchen Aram Beecham .                               [   Final Report         ]                    ********************************************************************** Diagnosis: Part A: SENTINEL NODE RIGHT BREAST: - NO TUMOR SEEN IN TWO SENTINEL LYMPH NODES (0/2). . Part B: SENTINEL NODE #2 RIGHT BREAST: - NO TUMOR SEEN IN TWO SENTINEL LYMPH NODES (0/2). . Part C: WIDE EXCISION RIGHT BREAST: - INVASIVE MAMMARY CARCINOMA, NO SPECIAL TYPE. - DUCTAL CARCINOMA IN SITU, NUCLEAR GRADE 3, COMEDO TYPE. - BIOPSY SITE CHANGES, MARKER CLIPS PRESENT. - THE MARGINS OF EXCISION ARE NEGATIVE. - SEE SUMMARY BELOW. Marland Kitchen CANCER CASE SUMMARY: INVASIVE CARCINOMAOF THE BREAST PROCEDURE: Wide excision without wire-guided localization LYMPH NODE SAMPLING: Sentinel lymph nodes, see parts A and B SPECIMEN LATERALITY: Right HISTOLOGIC TYPE OF INVASIVE CARCINOMA: Ductal, no special type TUMOR SIZE: 15 mm HISTOLOGIC GRADE: Nottingham Histologic Score Glandular / tubular differentiation: Score 3 Nuclear pleomorphism: Score 3 Mitotic rate: Score 3 Overall grade: Score 3 TUMOR FOCALITY: Single focus of invasive carcinoma DUCTAL CARCINOMA IN SITU (DCIS): DCIS is present. MARGINS:         Invasive carcinoma: margins uninvolved by invasive carcinoma; Distance from closest margin: 3.5 mm (deep)         DCIS: margins uninvolved by DCIS; Distance from closest margin: 3.8 mm (deep) LYMPH NODES:     Number of sentinel lymph nodes examined: 4         Total number of lymph nodes examined: 4         Number of lymph nodes with macrometastases: 0         Number of lymph nodes with micrometastases: 0         Number of lymph nodes with isolatedtumor cells: 0         Number of lymph nodes without tumor cells identified: 4 PATHOLOGIC STAGING, AJCC 7TH EDITION:         Primary tumor: pT1c         Regional lymph nodes: pN0(sn)         Distant metastasis: Not applicable ANCILLARY STUDIES:Testing was performed by [facility] for Molecular Biology and Pathology on a separate specimen, number [id number]. In  summary:         Estrogen receptor IHC: Negative, 0% cells with nuclear      positivity.         Progesterone receptor:Negative, 0% cells with nuclear         positivity.         HER2-neu FISH: Positive (AMPLIFIED)         HER2/CEP17 ratio 2.49 . XDB/12/14/2012 ********************************************************************** .  [   Final Report         ]                   Electronically signed:                                     . Lum Babe, MD, Pathologist .                               [  Final Report         ]                   Gross description:                           . A. Received fresh for frozen section labeled Adria Dill and sentinel node 1 are two pink yellow fatty lymph node candidates measuring 1.0 and 1.3 cm in greatest dimensions. The lymph nodes are entirely submitted for frozen section as FSA1 and the frozen section remnants are entirely resubmitted in cassette A1. . B. Received fresh for frozen section labeled Adria Dill and sentinel node right breast #2 are two pink yellow fatty lymph node candidates measuring 0.6 and 1.5 cm in greatest dimensions. The lymph nodes are entirely submitted for frozen section as FSB1 and the frozen section remnants are entirely resubmitted in cassette B1. . C. Received fresh labeled Adria Dill and wide excision right breast is a 9.0 x 7.2 x 4.3 cm fragment of yellow tan lobulated fibroadipose tissue with an attached 9.0 x 4.0 x 0.3 cm elliptical shaped fragment of tan lightly pigmented skin. The epidermal surfaces are corrugated. Silver metallic clip designates the cranial and medial margins. The margins are inked as follows: Superior - blue. Inferior - green. Posterior - black. Sectioning reveals 2.8 x 2.4 x 2.0 cm hemorrhagic blood clot filled biopsy cavity gross situated 0.5 cm from the deep margin, 1.1 cm from the superior margin, 1.0 cm deep of the skin and >2.5 cm from the remaining margins. There  are cylindrical shaped markers within the cavity. Located at the deep to medial aspect of the cavity is a 1.5 x 0.9 x 0.6 cm pink white lobulated firm mass grossly situated 0.4 cm from the deep margin and >2 cm from the remaining margins. Elsewhere immediately surrounding the biopsy cavity are areas of induration and apparent fat necrosis. Elsewhere the cut surfaces are fatty with focal areas of mild hemorrhage. Representative sections are submitted: C1 - lateral margin, perpendicular sections; C2 - medial margin, perpendicular sections; C3-C4 - mass in deep margin; C5 - additional representative section of mass; C6 - biopsy cavity and superior margin; C7 - inferior aspect of biopsy cavity; C8 - anterior aspect of biopsy cavity; C9 - lateral aspect of biopsy cavity; C10 - representative section of uninvolved breast parenchyma. The specimen was collected at 2:05 pm and placed in formalin at 2:24 pm and the approximate total fixation time is 6 hours. QAC/KCT .                               [   Final Report         ]                   Pathologist provided ICD-9: 174.2 .                               [   Final Report         ]                   CPT                                                        .  [id number], [id number], [id number], [id number], [phone], [place]            No: [id number]           [address], [place], [place] [address]           Lindon Romp, MD         [phone]                                         [id number]   Result(s) reported on 14 Dec 2012 at 02:53PM.   Relevent Results:   Relevant Scans and Labs mammograms ultrasound were reviewed   Assessment and Plan: Impression:   clinical stage I ER/PR negative HER-2/neu overexpressed invasive mammary carcinoma of the right breast status post wide local excision and sentinel node biopsy and 59 year old female. Plan:   I discussed the case personally with  Dr. Ma Hillock. Patient will undergo systemic chemotherapy plus a year of Herceptin. After her initial chemotherapy will reevaluate the patient to commence radiation therapy. I've recommended 5000 cGy to her right breast. Would also boost or scar another 1400 cGy electron beam. Risks and benefits of treatment including skin irritation, alteration of blood counts, fatigue, thickening of the breast, and some inclusion of some superficial lung on the right side were all explained in detail to the patient. Both her and her husband seem to comprehend my treatment plan well.  I would like to take this opportunity to thank you for allowing me to continue to participate in this patient's care.  Electronic Signatures: Armstead Peaks (MD)  (Signed 05-Aug-14 11:16)  Authored: HPI, Diagnosis, Past Hx, PFSH, Allergies, Home Meds, ROS, Nursing Notes, Physical Exam, LAB Results, Relevent Results, Encounter Assessment and Plan   Last Updated: 05-Aug-14 11:16 by Armstead Peaks (MD)

## 2014-09-05 NOTE — Op Note (Signed)
PATIENT NAME:  Karen Washington, Karen Washington MR#:  [id number] DATE OF BIRTH:  [birth year]  DATE OF PROCEDURE:  01/02/2013  PREOPERATIVE DIAGNOSIS: Right breast cancer, need for central venous access.   POSTOPERATIVE DIAGNOSIS:  Right breast cancer, need for central venous access.   OPERATIVE PROCEDURE: Left subclavian power port placement under ultrasound and fluoroscopic guidance.   OPERATING SURGEON: Hervey Ard, MD  ANESTHESIA: Attended local, 15 mL 1% Xylocaine plain.   CLINICAL NOTE: This 59 year old woman is felt to be a candidate for adjuvant chemotherapy and central venous access was requested by the treating oncologist.   OPERATIVE NOTE: The patient received Kefzol intravenously prior to the procedure. The patient was brought to the Operating Room and placed supine on the table. The left chest was then prepped with Betadine solution and draped. Ultrasound was used to confirm patency of the left subclavian vein. The vein was cannulated on a single stick under ultrasound guidance. The guidewire was passed but the path of the guidewire suggested it was in the subclavian artery rather than the subclavian vein as it tracked to the left of the midline. The guidewire was then withdrawn back under ultrasound guidance into the vein and readvanced into the SVC without difficulty. The tract was dilated at this time followed by passage of the catheter. This was positioned at the junction of the SVC and right atrium under fluoroscopic guidance. The catheter was then tunneled to a pocket on the left anterior chest. The port was attached. It easily irrigated and aspirated with the patient in the supine position. The wound was closed in layers with 3-0 Vicryl to the adipose layer and a running 4-0 Vicryl subcuticular suture for the skin. Benzoin and Steri-Strips followed by Telfa and Tegaderm dressing were applied. The catheter was flushed with 10 mL of saline prior to the completion of the procedure.   Erect chest  x-ray in the recovery room showed no evidence of an apical cap to suggest bleeding from the subclavian artery. No evidence of pneumothorax. Catheter tip as described above.    ____________________________ Robert Bellow, MD jwb:cs D: 01/02/2013 17:23:35 ET T: 01/02/2013 20:47:40 ET JOB#: [id number]  cc: Robert Bellow, MD, <Dictator> Sandeep R. Ma Hillock, MD Deborra Medina, MD Demetrio Leighty Amedeo Kinsman MD ELECTRONICALLY SIGNED 01/03/2013 14:33

## 2014-09-05 NOTE — Op Note (Signed)
PATIENT NAME:  Karen Washington, Karen Washington MR#:  [id number] DATE OF BIRTH:  1956-04-02  DATE OF PROCEDURE:  12/13/2012  PREOPERATIVE DIAGNOSIS: Invasive mammary carcinoma of the right breast, upper inner quadrant.   POSTOPERATIVE DIAGNOSIS: Invasive mammary carcinoma of the right breast, upper inner quadrant.   OPERATIVE PROCEDURE: Wide local excision, mastoplasty, sentinel node biopsy.   OPERATING SURGEON: Hervey Ard, MD   ANESTHESIA: General by LMA.   ESTIMATED BLOOD LOSS: 50 mL.   CLINICAL NOTE: This 59 year old woman was recently identified with a breast mass. Core biopsy showed evidence of invasive mammary carcinoma. She desired to proceed with breast conservation.   OPERATIVE NOTE: The patient was injected with technetium sulfur colloid approximately 2 hours prior to the procedure. She was brought to the operating room and underwent general anesthesia without difficulty. A total of 2 mL of methylene blue diluted 1:2 with normal saline was injected in the subareolar plexus. The breast was then prepped with ChloraPrep and draped. Attention was turned to the axilla and a small incision made at the area of increased uptake. The first node showed counts of approximately 400. The second node showed counts of approximately 100. Attached with this was a small nonsentinel node. All were reported as negative on Touch Preps by Delorse Lek, MD from pathology.   Attention was turned to the upper inner quadrant lesion. Ultrasound was used to identify the borders. It came within a few millimeters of the underlying dermis, and it was elected to excise an area of skin to allow for adequate margins. The area was outlined and Marcaine infiltrated for postoperative analgesia as had been done in the axilla. The skin was incised sharply, and the remaining dissection was completed with electrocautery. Hemostasis was with 3-0 Vicryl ties. The tissue was dissected down to and included the pectoralis fascia. The specimen was  orientated and sent for evaluation. The closest margin was approximately 3 to 4 mm, and this was the deep margin including the pectoralis fascia. The breast was then elevated off the underlying pectoralis muscle, taking the fascia with the breast tissue. This was extended to the clavicle superiorly and almost to the inframammary fold inferiorly. The defect was then closed approximating the deep tissue with interrupted 2-0 Vicryl figure-of-eight sutures. This reconstituted the upper inner quadrant volume fairly nicely without significant elevation of the nipple-areolar complex. The adipose layer was then approximated with interrupted 2-0 Vicryl sutures. The skin was closed with a running 3-0 Vicryl subcuticular suture. The axillary wound was closed with 2-0 Vicryl figure-of-eight sutures and a running 4-0 Vicryl subcuticular suture for the skin. Benzoin and Steri-Strips followed by a Telfa dressing, fluff gauze, Kerlix and an Ace wrap was then applied.   The patient tolerated the procedure well and was taken to the recovery room in stable condition.   ____________________________ Robert Bellow, MD jwb:cb D: 12/13/2012 21:07:06 ET T: 12/13/2012 21:15:18 ET JOB#: [id number]  cc: Robert Bellow, MD, <Dictator> Deborra Medina, MD Leif Loflin Amedeo Kinsman MD ELECTRONICALLY SIGNED 12/14/2012 14:10

## 2014-09-08 LAB — SURGICAL PATHOLOGY

## 2014-11-10 ENCOUNTER — Other Ambulatory Visit: Payer: Self-pay

## 2014-11-10 DIAGNOSIS — C50211 Malignant neoplasm of upper-inner quadrant of right female breast: Secondary | ICD-10-CM

## 2014-12-30 ENCOUNTER — Encounter: Payer: Self-pay | Admitting: General Surgery

## 2015-01-01 ENCOUNTER — Encounter: Payer: Self-pay | Admitting: General Surgery

## 2015-01-01 ENCOUNTER — Ambulatory Visit: Payer: BC Managed Care – PPO | Admitting: General Surgery

## 2015-01-01 ENCOUNTER — Ambulatory Visit (INDEPENDENT_AMBULATORY_CARE_PROVIDER_SITE_OTHER): Payer: BC Managed Care – PPO | Admitting: General Surgery

## 2015-01-01 VITALS — BP 106/70 | HR 78 | Resp 13 | Ht 66.0 in | Wt 137.6 lb

## 2015-01-01 DIAGNOSIS — C50211 Malignant neoplasm of upper-inner quadrant of right female breast: Secondary | ICD-10-CM | POA: Diagnosis not present

## 2015-01-01 NOTE — Patient Instructions (Signed)
Continue self breast exams. Call office for any new breast issues or concerns. Follow up in one year.   

## 2015-01-01 NOTE — Progress Notes (Signed)
Patient ID: Karen Washington, female   DOB: June 19, 1955, 59 y.o.   MRN: [id number]  Chief Complaint  Patient presents with  . Follow-up    mammogram    HPI Karen Washington is a 59 y.o. female who presents for a breast cancer follow up. Her most recent mammogram was done on 12/30/14. Patient does perform regular self breast checks and gets regular mammograms done. She has no new breast complaints. She reports that the left clavicular lymph node has not changed in size since last seen.    HPI  Past Medical History  Diagnosis Date  . Shingles 2014  . 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 Whole breast radiation.   Adjuvant chemotherapy with Docetaxel/Carboplatin/Herceptin starting on 01/15/13, completed 6 cycles of Docetaxel/Carboplatin on 04/29/13.  Completed 52 weeks of Herceptin on 01/06/14.    Past Surgical History  Procedure Laterality Date  . Breast surgery Right December 13 2012    lumpectomy  . Portacath placement  2014  . Mastopexy Left Nov 2015    Dr Tula Nakayama  . Port-a-cath removal  02-05-14    Dr Bary Castilla    Family History  Problem Relation Age of Onset  . Cancer Mother     uterine   . Cancer Maternal Grandmother     breast  . Cancer Father     prostate    Social History Social History  Substance Use Topics  . Smoking status: Never Smoker   . Smokeless tobacco: Never Used  . Alcohol Use: Yes    No Known Allergies  Current Outpatient Prescriptions  Medication Sig Dispense Refill  . ALPRAZolam (XANAX) 0.25 MG tablet Take 1 tablet (0.25 mg total) by mouth 2 (two) times daily as needed for sleep or anxiety. 20 tablet 0  . calcium carbonate (OS-CAL) 600 MG TABS Take 600 mg by mouth 2 (two) times daily with a meal.    . cholecalciferol (VITAMIN D) 1000 UNITS tablet Take 1,000 Units by mouth daily.    Marland Kitchen ibuprofen (ADVIL,MOTRIN) 200 MG tablet Take 200 mg by mouth every 6  (six) hours as needed for pain.    . Multiple Vitamins-Minerals ([employer] SPECTRAVITE ULTRA WOMEN PO) Take by mouth daily.     No current facility-administered medications for this visit.    Review of Systems Review of Systems  Constitutional: Negative.   Respiratory: Negative.   Cardiovascular: Negative.     Blood pressure 106/70, pulse 78, resp. rate 13, height 5\' 6"  (1.676 m), weight 137 lb 9.6 oz (62.415 kg).  Physical Exam Physical Exam  Constitutional: She is oriented to person, place, and time. She appears well-developed and well-nourished.  Eyes: Conjunctivae are normal. No scleral icterus.  Neck: Neck supple.    Cardiovascular: Normal rate, regular rhythm and normal heart sounds.   Pulmonary/Chest: Effort normal and breath sounds normal. Right breast exhibits no inverted nipple, no mass, no nipple discharge, no skin change and no tenderness. Left breast exhibits no inverted nipple, no mass, no nipple discharge, no skin change and no tenderness.    Lymphadenopathy:    She has no cervical adenopathy.    She has no axillary adenopathy.  Neurological: She is alert and oriented to person, place, and time.  Skin: Skin is warm and dry.  Psychiatric: She has a normal mood and affect.    Data Reviewed Ultrasound of the left supraclavicular  space showed the palpable area represents an arterial vessel. (No images, no charge.  Bilateral diagnostic mammograms dated 12/30/2014 completed at [facility]- showed postsurgical changes. BI-RADS-2. These films were reviewed.   Assessment    Doing well status post right breast conservation for malignancy.    Plan    We'll plan for a follow-up examination with bilateral diagnostic mammograms in one year.      PCP: Toy Cookey 01/03/2015, 7:48 AM

## 2015-01-03 ENCOUNTER — Encounter: Payer: Self-pay | Admitting: General Surgery

## 2015-01-15 ENCOUNTER — Telehealth: Payer: Self-pay | Admitting: *Deleted

## 2015-01-15 NOTE — Telephone Encounter (Signed)
-----   Message from Robert Bellow, MD sent at 01/15/2015  8:10 AM EDT ----- Please arrange for Ms. Karen Washington to see Ella Jubilee at [facility] regarding a prominent left vertebral vessel. He reported they can arrange for her to have a formal carotid ultrasound in his office and a visit with him the same day. I've notify the patient that you will be contacting her about this. Thank you

## 2015-01-15 NOTE — Telephone Encounter (Signed)
Message left on home and cell numbers for patient to call the office.  Patient has been scheduled for an appointment with Dr. Delana Meyer for 01-26-15 at 11 am (arrive 10:45 am). This patient will need to take her insurance card, photo I.D., and a list of her medications to the appointment.   If patient has questions, she can call [facility] directly at [phone].

## 2015-01-16 NOTE — Telephone Encounter (Signed)
Patient notified of appointment date and time. 

## 2015-01-26 ENCOUNTER — Other Ambulatory Visit: Payer: Self-pay | Admitting: Vascular Surgery

## 2015-01-26 DIAGNOSIS — I721 Aneurysm of artery of upper extremity: Secondary | ICD-10-CM

## 2015-01-30 ENCOUNTER — Ambulatory Visit
Admission: RE | Admit: 2015-01-30 | Discharge: 2015-01-30 | Disposition: A | Discharge status: Home / Self Care | Payer: BC Managed Care – PPO | Source: Ambulatory Visit | Attending: Vascular Surgery | Admitting: Vascular Surgery

## 2015-01-30 DIAGNOSIS — I721 Aneurysm of artery of upper extremity: Secondary | ICD-10-CM | POA: Diagnosis present

## 2015-01-30 MED ORDER — IOHEXOL 350 MG/ML SOLN
75.0000 mL | Freq: Once | INTRAVENOUS | Status: AC | PRN
  Administered 2015-01-30: 75 mL via INTRAVENOUS

## 2015-03-04 ENCOUNTER — Other Ambulatory Visit: Payer: Self-pay | Admitting: *Deleted

## 2015-03-04 DIAGNOSIS — C50211 Malignant neoplasm of upper-inner quadrant of right female breast: Secondary | ICD-10-CM

## 2015-03-06 ENCOUNTER — Inpatient Hospital Stay: Payer: BC Managed Care – PPO | Attending: Internal Medicine

## 2015-03-06 ENCOUNTER — Inpatient Hospital Stay (HOSPITAL_BASED_OUTPATIENT_CLINIC_OR_DEPARTMENT_OTHER): Payer: BC Managed Care – PPO | Admitting: Internal Medicine

## 2015-03-06 VITALS — BP 122/70 | HR 67 | Temp 97.5°F | Wt 138.7 lb

## 2015-03-06 DIAGNOSIS — Z79899 Other long term (current) drug therapy: Secondary | ICD-10-CM | POA: Insufficient documentation

## 2015-03-06 DIAGNOSIS — Z171 Estrogen receptor negative status [ER-]: Secondary | ICD-10-CM

## 2015-03-06 DIAGNOSIS — Z9221 Personal history of antineoplastic chemotherapy: Secondary | ICD-10-CM | POA: Diagnosis not present

## 2015-03-06 DIAGNOSIS — C50211 Malignant neoplasm of upper-inner quadrant of right female breast: Secondary | ICD-10-CM | POA: Diagnosis not present

## 2015-03-06 DIAGNOSIS — Z923 Personal history of irradiation: Secondary | ICD-10-CM | POA: Insufficient documentation

## 2015-03-06 LAB — CBC WITH DIFFERENTIAL/PLATELET
BASOS ABS: 0.1 10*3/uL (ref 0–0.1)
Basophils Relative: 1 %
Eosinophils Absolute: 0.1 10*3/uL (ref 0–0.7)
Eosinophils Relative: 1 %
HEMATOCRIT: 43.1 % (ref 35.0–47.0)
Hemoglobin: 14.4 g/dL (ref 12.0–16.0)
LYMPHS ABS: 1.1 10*3/uL (ref 1.0–3.6)
LYMPHS PCT: 16 %
MCH: 31.3 pg (ref 26.0–34.0)
MCHC: 33.6 g/dL (ref 32.0–36.0)
MCV: 93.4 fL (ref 80.0–100.0)
Monocytes Absolute: 0.6 10*3/uL (ref 0.2–0.9)
Monocytes Relative: 9 %
NEUTROS ABS: 5.2 10*3/uL (ref 1.4–6.5)
Neutrophils Relative %: 73 %
Platelets: 241 10*3/uL (ref 150–440)
RBC: 4.61 MIL/uL (ref 3.80–5.20)
RDW: 12.5 % (ref 11.5–14.5)
WBC: 7 10*3/uL (ref 3.6–11.0)

## 2015-03-06 LAB — HEPATIC FUNCTION PANEL
ALK PHOS: 56 U/L (ref 38–126)
ALT: 20 U/L (ref 14–54)
AST: 21 U/L (ref 15–41)
Albumin: 4.5 g/dL (ref 3.5–5.0)
Total Bilirubin: 0.6 mg/dL (ref 0.3–1.2)
Total Protein: 7.3 g/dL (ref 6.5–8.1)

## 2015-03-06 LAB — CREATININE, SERUM
Creatinine, Ser: 0.93 mg/dL (ref 0.44–1.00)
GFR calc non Af Amer: >60 mL/min (ref >60)

## 2015-03-06 NOTE — Progress Notes (Signed)
Patient offers no concerns today.  Here today for follow up regarding breast cancer.

## 2015-03-06 NOTE — Progress Notes (Signed)
[place] OFFICE PROGRESS NOTE  Patient Care Team: Crecencio Mc, MD as PCP - General (Internal Medicine) Robert Bellow, MD (General Surgery) Crecencio Mc, MD (Internal Medicine)   SUMMARY OF ONCOLOGIC HISTORY:  # RIGHT BREAST CA STAGE I ER/PR NEG; Her-2 NEU POSITIVE;s/p LUMPEC & SLNBx; s/p TCH x 6; s/p RT   INTERVAL HISTORY:  A very pleasant 59 year old female patient with above history of stage I ER/PR negative HER-2/neu positive breast cancer status post chemotherapy post radiation is here for follow-up.  Patient is doing well. She denies any new lumps or bumps. She denies any chest pain or shortness of breath or cough. Denies any nausea vomiting.  REVIEW OF SYSTEMS:  A complete 10 point review of system is done which is negative except mentioned above/history of present illness.   PAST MEDICAL HISTORY :  Past Medical History  Diagnosis Date  . Shingles 2014  . 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 Whole breast radiation.   Adjuvant chemotherapy with Docetaxel/Carboplatin/Herceptin starting on 01/15/13, completed 6 cycles of Docetaxel/Carboplatin on 04/29/13.  Completed 52 weeks of Herceptin on 01/06/14.    PAST SURGICAL HISTORY :   Past Surgical History  Procedure Laterality Date  . Breast surgery Right December 13 2012    lumpectomy  . Portacath placement  2014  . Mastopexy Left Nov 2015    Dr Tula Nakayama  . Port-a-cath removal  02-05-14    Dr Bary Castilla    FAMILY HISTORY :   Family History  Problem Relation Age of Onset  . Cancer Mother     uterine   . Cancer Maternal Grandmother     breast  . Cancer Father     prostate    SOCIAL HISTORY:   Social History  Substance Use Topics  . Smoking status: Never Smoker   . Smokeless tobacco: Never Used  . Alcohol Use: Yes    ALLERGIES:  has No Known Allergies.  MEDICATIONS:  Current Outpatient  Prescriptions  Medication Sig Dispense Refill  . ALPRAZolam (XANAX) 0.25 MG tablet Take 1 tablet (0.25 mg total) by mouth 2 (two) times daily as needed for sleep or anxiety. 20 tablet 0   No current facility-administered medications for this visit.    PHYSICAL EXAMINATION: ECOG PERFORMANCE STATUS: 0 - Asymptomatic  BP 122/70 mmHg  Pulse 67  Temp(Src) 97.5 F (36.4 C) (Tympanic)  Wt 138 lb 10.7 oz (62.9 kg)  Filed Weights   03/06/15 1130  Weight: 138 lb 10.7 oz (62.9 kg)    GENERAL: Well-nourished well-developed; Alert, no distress and comfortable.   Alone.  EYES: no pallor or icterus OROPHARYNX: no thrush or ulceration; good dentition  NECK: supple, no masses felt LYMPH:  no palpable lymphadenopathy in the cervical, axillary or inguinal regions LUNGS: clear to auscultation and  No wheeze or crackles HEART/CVS: regular rate & rhythm and no murmurs; No lower extremity edema ABDOMEN:abdomen soft, non-tender and normal bowel sounds Musculoskeletal:no cyanosis of digits and no clubbing  PSYCH: alert & oriented x 3 with fluent speech NEURO: no focal motor/sensory deficits SKIN:  no rashes or significant lesions RIGHT & LEFT BREAST- no new lumps or bumps felt. Right breast shows noted; no skin changes.  LABORATORY DATA:  I have reviewed the data as listed    Component Value Date/Time   NA 142 01/06/2014 1338   NA  141 11/27/2012 1127   NA 139 11/10/2011 0958   K 3.8 01/06/2014 1338   K 4.9 11/27/2012 1127   CL 106 01/06/2014 1338   CL 99 11/27/2012 1127   CO2 31 01/06/2014 1338   CO2 26 11/27/2012 1127   GLUCOSE 80 01/06/2014 1338   GLUCOSE 80 11/27/2012 1127   GLUCOSE 89 11/10/2011 0958   BUN 16 01/06/2014 1338   BUN 17 11/27/2012 1127   BUN 14 11/10/2011 0958   CREATININE 0.87 08/29/2014 1053   CREATININE 1.00 11/27/2012 1127   CALCIUM 9.2 01/06/2014 1338   CALCIUM 9.8 11/27/2012 1127   PROT 7.3 08/29/2014 1053   PROT 6.8 11/19/2012 1546   ALBUMIN 4.5 08/29/2014  1053   ALBUMIN 4.2 11/19/2012 1546   AST 24 08/29/2014 1053   AST 19 11/19/2012 1546   ALT 20 08/29/2014 1053   ALT 16 11/19/2012 1546   ALKPHOS 69 08/29/2014 1053   ALKPHOS 47 11/19/2012 1546   BILITOT 0.5 08/29/2014 1053   BILITOT 0.6 11/19/2012 1546   GFRNONAA >60 08/29/2014 1053   GFRNONAA >60 04/28/2014 1418   GFRNONAA 64 11/27/2012 1127   GFRAA >60 08/29/2014 1053   GFRAA >60 04/28/2014 1418   GFRAA 73 11/27/2012 1127    No results found for: SPEP, UPEP  Lab Results  Component Value Date   WBC 7.0 03/06/2015   NEUTROABS 5.2 03/06/2015   HGB 14.4 03/06/2015   HCT 43.1 03/06/2015   MCV 93.4 03/06/2015   PLT 241 03/06/2015      Chemistry      Component Value Date/Time   NA 142 01/06/2014 1338   NA 141 11/27/2012 1127   NA 139 11/10/2011 0958   K 3.8 01/06/2014 1338   K 4.9 11/27/2012 1127   CL 106 01/06/2014 1338   CL 99 11/27/2012 1127   CO2 31 01/06/2014 1338   CO2 26 11/27/2012 1127   BUN 16 01/06/2014 1338   BUN 17 11/27/2012 1127   BUN 14 11/10/2011 0958   CREATININE 0.87 08/29/2014 1053   CREATININE 1.00 11/27/2012 1127      Component Value Date/Time   CALCIUM 9.2 01/06/2014 1338   CALCIUM 9.8 11/27/2012 1127   ALKPHOS 69 08/29/2014 1053   ALKPHOS 47 11/19/2012 1546   AST 24 08/29/2014 1053   AST 19 11/19/2012 1546   ALT 20 08/29/2014 1053   ALT 16 11/19/2012 1546   BILITOT 0.5 08/29/2014 1053   BILITOT 0.6 11/19/2012 1546       RADIOGRAPHIC STUDIES: I have personally reviewed the radiological images as listed and agreed with the findings in the report. No results found.   ASSESSMENT & PLAN:   # Right breast cancer stage I ER/PR negative HER-2/neu positive. On surveillance. Clinically no evidence of recurrence noted.   # Reviewed with the patient the rationale for follow-ups. For now will continue follow-up every 6 months with labs. Patient will continue mammograms/follow-up with surgery and radiation oncology.  We will see the patient  back in 6 months with labs/    No orders of the defined types were placed in this encounter.   All questions were answered. The patient knows to call the clinic with any problems, questions or concerns. No barriers to learning was detected. I spent 15 minutes counseling the patient face to face. The total time spent in the appointment was 30 minutes and more than 50% was on counseling and review of test results     Cammie Sickle,  MD 03/06/2015 11:43 AM

## 2015-03-07 LAB — CANCER ANTIGEN 27.29: CA 27.29: 15.2 U/mL (ref 0.0–38.6)

## 2015-08-17 ENCOUNTER — Inpatient Hospital Stay
Admission: RE | Admit: 2015-08-17 | Payer: BC Managed Care – PPO | Source: Ambulatory Visit | Admitting: Radiation Oncology

## 2015-08-17 ENCOUNTER — Ambulatory Visit: Payer: BC Managed Care – PPO | Admitting: Radiation Oncology

## 2015-09-04 ENCOUNTER — Other Ambulatory Visit: Payer: BC Managed Care – PPO

## 2015-09-04 ENCOUNTER — Ambulatory Visit: Payer: BC Managed Care – PPO | Admitting: Internal Medicine

## 2015-09-07 ENCOUNTER — Inpatient Hospital Stay: Payer: BC Managed Care – PPO

## 2015-09-07 ENCOUNTER — Inpatient Hospital Stay: Payer: BC Managed Care – PPO | Attending: Internal Medicine | Admitting: Internal Medicine

## 2015-09-07 VITALS — BP 113/70 | HR 73 | Temp 97.5°F | Wt 142.2 lb

## 2015-09-07 DIAGNOSIS — Z79899 Other long term (current) drug therapy: Secondary | ICD-10-CM | POA: Diagnosis not present

## 2015-09-07 DIAGNOSIS — C50211 Malignant neoplasm of upper-inner quadrant of right female breast: Secondary | ICD-10-CM | POA: Insufficient documentation

## 2015-09-07 DIAGNOSIS — Z171 Estrogen receptor negative status [ER-]: Secondary | ICD-10-CM | POA: Insufficient documentation

## 2015-09-07 DIAGNOSIS — Z9221 Personal history of antineoplastic chemotherapy: Secondary | ICD-10-CM | POA: Diagnosis not present

## 2015-09-07 DIAGNOSIS — Z923 Personal history of irradiation: Secondary | ICD-10-CM | POA: Insufficient documentation

## 2015-09-07 LAB — COMPREHENSIVE METABOLIC PANEL
ALK PHOS: 62 U/L (ref 38–126)
ALT: 16 U/L (ref 14–54)
ANION GAP: 4 — AB (ref 5–15)
AST: 21 U/L (ref 15–41)
Albumin: 4.7 g/dL (ref 3.5–5.0)
BUN: 18 mg/dL (ref 6–20)
CALCIUM: 9.4 mg/dL (ref 8.9–10.3)
CO2: 31 mmol/L (ref 22–32)
Chloride: 102 mmol/L (ref 101–111)
Creatinine, Ser: 1.05 mg/dL — ABNORMAL HIGH (ref 0.44–1.00)
GFR calc non Af Amer: 58 mL/min — ABNORMAL LOW (ref >60)
GLUCOSE: 97 mg/dL (ref 65–99)
Potassium: 3.9 mmol/L (ref 3.5–5.1)
Sodium: 137 mmol/L (ref 135–145)
Total Bilirubin: 0.4 mg/dL (ref 0.3–1.2)
Total Protein: 7.1 g/dL (ref 6.5–8.1)

## 2015-09-07 LAB — CBC WITH DIFFERENTIAL/PLATELET
Basophils Absolute: 0 10*3/uL (ref 0–0.1)
Basophils Relative: 1 %
Eosinophils Absolute: 0.1 10*3/uL (ref 0–0.7)
Eosinophils Relative: 2 %
HEMATOCRIT: 43.7 % (ref 35.0–47.0)
Hemoglobin: 14.9 g/dL (ref 12.0–16.0)
LYMPHS ABS: 1.4 10*3/uL (ref 1.0–3.6)
Lymphocytes Relative: 23 %
MCH: 31.9 pg (ref 26.0–34.0)
MCHC: 34.1 g/dL (ref 32.0–36.0)
MCV: 93.4 fL (ref 80.0–100.0)
MONOS PCT: 9 %
Monocytes Absolute: 0.5 10*3/uL (ref 0.2–0.9)
NEUTROS ABS: 4.1 10*3/uL (ref 1.4–6.5)
NEUTROS PCT: 65 %
Platelets: 256 10*3/uL (ref 150–440)
RBC: 4.68 MIL/uL (ref 3.80–5.20)
RDW: 12.9 % (ref 11.5–14.5)
WBC: 6.2 10*3/uL (ref 3.6–11.0)

## 2015-09-07 NOTE — Progress Notes (Signed)
Patient here for 6 month follow up

## 2015-09-07 NOTE — Progress Notes (Signed)
[facility] OFFICE PROGRESS NOTE  Patient Care Team: Crecencio Mc, MD as PCP - General (Internal Medicine) Robert Bellow, MD (General Surgery) Crecencio Mc, MD (Internal Medicine)   SUMMARY OF ONCOLOGIC HISTORY:  # RIGHT BREAST CA STAGE I ER/PR NEG; Her-2 NEU POSITIVE;s/p LUMPEC & SLNBx; s/p TCH x 6; s/p RT   INTERVAL HISTORY:  A very pleasant 60 year old female patient with above history of stage I ER/PR negative HER-2/neu positive breast cancer status post chemotherapy post radiation is here for follow-up.  She denies any new lumps or bumps. She denies any chest pain or shortness of breath or cough. Denies any nausea vomiting. Patient is doing well.  REVIEW OF SYSTEMS:  A complete 10 point review of system is done which is negative except mentioned above/history of present illness.   PAST MEDICAL HISTORY :  Past Medical History  Diagnosis Date  . Shingles 2014  . 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 Whole breast radiation.   Adjuvant chemotherapy with Docetaxel/Carboplatin/Herceptin starting on 01/15/13, completed 6 cycles of Docetaxel/Carboplatin on 04/29/13.  Completed 52 weeks of Herceptin on 01/06/14.    PAST SURGICAL HISTORY :   Past Surgical History  Procedure Laterality Date  . Breast surgery Right December 13 2012    lumpectomy  . Portacath placement  2014  . Mastopexy Left Nov 2015    Dr Tula Nakayama  . Port-a-cath removal  02-05-14    Dr Bary Castilla    FAMILY HISTORY :   Family History  Problem Relation Age of Onset  . Cancer Mother     uterine   . Cancer Maternal Grandmother     breast  . Cancer Father     prostate    SOCIAL HISTORY:   Social History  Substance Use Topics  . Smoking status: Never Smoker   . Smokeless tobacco: Never Used  . Alcohol Use: Yes    ALLERGIES:  has No Known Allergies.  MEDICATIONS:  Current Outpatient  Prescriptions  Medication Sig Dispense Refill  . ALPRAZolam (XANAX) 0.25 MG tablet Take 1 tablet (0.25 mg total) by mouth 2 (two) times daily as needed for sleep or anxiety. 20 tablet 0   No current facility-administered medications for this visit.    PHYSICAL EXAMINATION: ECOG PERFORMANCE STATUS: 0 - Asymptomatic  BP 113/70 mmHg  Pulse 73  Temp(Src) 97.5 F (36.4 C) (Tympanic)  Wt 142 lb 3.2 oz (64.5 kg)  Filed Weights   09/07/15 1458  Weight: 142 lb 3.2 oz (64.5 kg)    GENERAL: Well-nourished well-developed; Alert, no distress and comfortable.   Alone.  EYES: no pallor or icterus OROPHARYNX: no thrush or ulceration; good dentition  NECK: supple, no masses felt LYMPH:  no palpable lymphadenopathy in the cervical, axillary or inguinal regions LUNGS: clear to auscultation and  No wheeze or crackles HEART/CVS: regular rate & rhythm and no murmurs; No lower extremity edema ABDOMEN:abdomen soft, non-tender and normal bowel sounds Musculoskeletal:no cyanosis of digits and no clubbing  PSYCH: alert & oriented x 3 with fluent speech NEURO: no focal motor/sensory deficits SKIN:  no rashes or significant lesions RIGHT & LEFT BREAST- [ Done in presence of the nurse]no new lumps or bumps felt. Right breast shows noted; no skin changes.  LABORATORY DATA:  I have reviewed the data as listed    Component Value Date/Time   NA  137 09/07/2015 1433   NA 142 01/06/2014 1338   NA 141 11/27/2012 1127   K 3.9 09/07/2015 1433   K 3.8 01/06/2014 1338   CL 102 09/07/2015 1433   CL 106 01/06/2014 1338   CO2 31 09/07/2015 1433   CO2 31 01/06/2014 1338   GLUCOSE 97 09/07/2015 1433   GLUCOSE 80 01/06/2014 1338   GLUCOSE 80 11/27/2012 1127   BUN 18 09/07/2015 1433   BUN 16 01/06/2014 1338   BUN 17 11/27/2012 1127   CREATININE 1.05* 09/07/2015 1433   CREATININE 0.87 08/29/2014 1053   CALCIUM 9.4 09/07/2015 1433   CALCIUM 9.2 01/06/2014 1338   PROT 7.1 09/07/2015 1433   PROT 7.3 08/29/2014  1053   ALBUMIN 4.7 09/07/2015 1433   ALBUMIN 4.5 08/29/2014 1053   AST 21 09/07/2015 1433   AST 24 08/29/2014 1053   ALT 16 09/07/2015 1433   ALT 20 08/29/2014 1053   ALKPHOS 62 09/07/2015 1433   ALKPHOS 69 08/29/2014 1053   BILITOT 0.4 09/07/2015 1433   BILITOT 0.5 08/29/2014 1053   GFRNONAA 58* 09/07/2015 1433   GFRNONAA >60 08/29/2014 1053   GFRNONAA >60 04/28/2014 1418   GFRAA >60 09/07/2015 1433   GFRAA >60 08/29/2014 1053   GFRAA >60 04/28/2014 1418    No results found for: SPEP, UPEP  Lab Results  Component Value Date   WBC 6.2 09/07/2015   NEUTROABS 4.1 09/07/2015   HGB 14.9 09/07/2015   HCT 43.7 09/07/2015   MCV 93.4 09/07/2015   PLT 256 09/07/2015      Chemistry      Component Value Date/Time   NA 137 09/07/2015 1433   NA 142 01/06/2014 1338   NA 141 11/27/2012 1127   K 3.9 09/07/2015 1433   K 3.8 01/06/2014 1338   CL 102 09/07/2015 1433   CL 106 01/06/2014 1338   CO2 31 09/07/2015 1433   CO2 31 01/06/2014 1338   BUN 18 09/07/2015 1433   BUN 16 01/06/2014 1338   BUN 17 11/27/2012 1127   CREATININE 1.05* 09/07/2015 1433   CREATININE 0.87 08/29/2014 1053      Component Value Date/Time   CALCIUM 9.4 09/07/2015 1433   CALCIUM 9.2 01/06/2014 1338   ALKPHOS 62 09/07/2015 1433   ALKPHOS 69 08/29/2014 1053   AST 21 09/07/2015 1433   AST 24 08/29/2014 1053   ALT 16 09/07/2015 1433   ALT 20 08/29/2014 1053   BILITOT 0.4 09/07/2015 1433   BILITOT 0.5 08/29/2014 1053        ASSESSMENT & PLAN:   # Right breast cancer stage I ER/PR negative HER-2/neu positive. On surveillance. Clinically no evidence of recurrence noted. She continues to follow up with Dr. Bary Castilla.   # Reviewed the labs within normal limits. Creatinine at 1.06/ recommended continued hydration.  # We will see the patient back in 6 months with labs.  Patient is due for mammogram in August 2017.     Cammie Sickle, MD 09/07/2015 3:59 PM

## 2015-09-08 LAB — CANCER ANTIGEN 27.29: CA 27.29: 15 U/mL (ref 0.0–38.6)

## 2016-01-01 ENCOUNTER — Encounter: Payer: Self-pay | Admitting: General Surgery

## 2016-01-11 ENCOUNTER — Ambulatory Visit (INDEPENDENT_AMBULATORY_CARE_PROVIDER_SITE_OTHER): Payer: BC Managed Care – PPO | Admitting: General Surgery

## 2016-01-11 ENCOUNTER — Encounter: Payer: Self-pay | Admitting: General Surgery

## 2016-01-11 VITALS — BP 110/68 | HR 76 | Resp 12 | Ht 66.0 in | Wt 147.0 lb

## 2016-01-11 DIAGNOSIS — C50211 Malignant neoplasm of upper-inner quadrant of right female breast: Secondary | ICD-10-CM

## 2016-01-11 NOTE — Patient Instructions (Addendum)
Patient will be asked to return to the office in one year with a bilateral diagnotic  mammogram. 

## 2016-01-11 NOTE — Progress Notes (Signed)
Patient ID: Karen Washington, female   DOB: [birth year], 58 y.o.   MRN: [id number]  Chief Complaint  Patient presents with  . Follow-up    mammogram    HPI Karen Washington is a 60 y.o. female who presents for a breast evaluation. The most recent mammogram was done on 01/01/16.  Patient does perform regular self breast checks and gets regular mammograms done.Patient had bugs bits on Friday all over her stomach area.   HPI  Past Medical History:  Diagnosis Date  . Breast cancer of upper-inner quadrant of right female breast ([place]) July, 2014   Right breast 1.5 cm, node negative, ER -, PR -, Her 2 neu over-expressed. Whole breast radiation.   Adjuvant chemotherapy with Docetaxel/Carboplatin/Herceptin starting on 01/15/13, completed 6 cycles of Docetaxel/Carboplatin on 04/29/13.  Completed 52 weeks of Herceptin on 01/06/14.  . Shingles 2014    Past Surgical History:  Procedure Laterality Date  . BREAST SURGERY Right December 13 2012   lumpectomy  . COLONOSCOPY  2011  . MASTOPEXY Left Nov 2015   Dr Tula Nakayama  . PORT-A-CATH REMOVAL  02-05-14   Dr Bary Castilla  . PORTACATH PLACEMENT  2014    Family History  Problem Relation Age of Onset  . Cancer Mother     uterine   . Cancer Maternal Grandmother     breast  . Cancer Father     prostate    Social History Social History  Substance Use Topics  . Smoking status: Never Smoker  . Smokeless tobacco: Never Used  . Alcohol use Yes    No Known Allergies  No current outpatient prescriptions on file.   No current facility-administered medications for this visit.     Review of Systems Review of Systems  Constitutional: Negative.   Respiratory: Negative.   Cardiovascular: Negative.     Blood pressure 110/68, pulse 76, resp. rate 12, height 5\' 6"  (1.676 m), weight 147 lb (66.7 kg).  Physical Exam Physical Exam  Constitutional: She is oriented to person, place, and time. She appears well-developed and well-nourished.  Eyes: Conjunctivae are normal.  No scleral icterus.  Neck: Neck supple.  Cardiovascular: Normal rate, regular rhythm and normal heart sounds.   Pulmonary/Chest: Effort normal and breath sounds normal. Right breast exhibits no inverted nipple, no mass, no nipple discharge, no skin change and no tenderness. Left breast exhibits no inverted nipple, no mass, no nipple discharge, no skin change and no tenderness.    Abdominal: Soft. Bowel sounds are normal. There is no tenderness.  Lymphadenopathy:    She has no cervical adenopathy.    She has no axillary adenopathy.  Neurological: She is alert and oriented to person, place, and time.  Skin: Skin is warm and dry.    Data Reviewed Bilateral mammograms dated 01/01/2016 were reviewed. Postsurgical changes. BI-RADS-2.  Assessment    Benign breast exam.    Plan    The patient was notified that diagnostic mammograms may not be available in [place] at [facility] system office and if this is the case she'll be contacted to determine if she would like to have them in [place] or transfer her care to the [facility].    Patient will be asked to return to the office in one year with a bilateral diagnotic mammogram.  This information has been scribed by Gaspar Cola CMA.  Karen Washington 01/11/2016, 9:09 PM

## 2016-02-03 ENCOUNTER — Encounter (INDEPENDENT_AMBULATORY_CARE_PROVIDER_SITE_OTHER): Payer: Self-pay

## 2016-02-10 ENCOUNTER — Telehealth: Payer: Self-pay | Admitting: *Deleted

## 2016-03-04 ENCOUNTER — Other Ambulatory Visit (INDEPENDENT_AMBULATORY_CARE_PROVIDER_SITE_OTHER): Payer: Self-pay | Admitting: Vascular Surgery

## 2016-03-04 DIAGNOSIS — Q7649 Other congenital malformations of spine, not associated with scoliosis: Secondary | ICD-10-CM

## 2016-03-04 DIAGNOSIS — I729 Aneurysm of unspecified site: Secondary | ICD-10-CM

## 2016-03-07 ENCOUNTER — Encounter (INDEPENDENT_AMBULATORY_CARE_PROVIDER_SITE_OTHER): Payer: Self-pay | Admitting: Vascular Surgery

## 2016-03-07 ENCOUNTER — Ambulatory Visit (INDEPENDENT_AMBULATORY_CARE_PROVIDER_SITE_OTHER): Payer: BC Managed Care – PPO | Admitting: Vascular Surgery

## 2016-03-07 ENCOUNTER — Ambulatory Visit (INDEPENDENT_AMBULATORY_CARE_PROVIDER_SITE_OTHER): Payer: BC Managed Care – PPO

## 2016-03-07 VITALS — BP 124/78 | HR 68 | Resp 15 | Ht 66.0 in | Wt 140.0 lb

## 2016-03-07 DIAGNOSIS — I721 Aneurysm of artery of upper extremity: Secondary | ICD-10-CM

## 2016-03-07 DIAGNOSIS — C50211 Malignant neoplasm of upper-inner quadrant of right female breast: Secondary | ICD-10-CM

## 2016-03-07 DIAGNOSIS — I729 Aneurysm of unspecified site: Secondary | ICD-10-CM

## 2016-03-07 DIAGNOSIS — Q7649 Other congenital malformations of spine, not associated with scoliosis: Secondary | ICD-10-CM

## 2016-03-07 NOTE — Progress Notes (Signed)
[facility] Admission History & Physical  MRN : [id number]  Karen Washington is a 60 y.o. (10/09/1955) female who presents with chief complaint of  Chief Complaint  Patient presents with  . Re-evaluation    Ultrasound follow up  .  History of Present Illness:  The patient is seen for follow up evaluation of mass left shoulder.   No supraclavicular pain no left arm symptoms The patient denies interval amaurosis fugax. There is no recent history of TIA symptoms or focal motor deficits. There is no prior documented CVA.  The patient is taking enteric-coated aspirin 81 mg daily.  There is no history of migraine headaches. There is no history of seizures.  The patient does not have a history of coronary artery disease, no recent episodes of angina or shortness of breath. The patient denies PAD or claudication symptoms. There is no history of hyperlipidemia.   No outpatient prescriptions have been marked as taking for the 03/07/16 encounter (Office Visit) with Katha Cabal, MD.    Past Medical History:  Diagnosis Date  . 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 Whole breast radiation.   Adjuvant chemotherapy with Docetaxel/Carboplatin/Herceptin starting on 01/15/13, completed 6 cycles of Docetaxel/Carboplatin on 04/29/13.  Completed 52 weeks of Herceptin on 01/06/14.  . Shingles 2014    Past Surgical History:  Procedure Laterality Date  . BREAST SURGERY Right December 13 2012   lumpectomy  . COLONOSCOPY  2011  . MASTOPEXY Left Nov 2015   Dr Tula Nakayama  . PORT-A-CATH REMOVAL  02-05-14   Dr Bary Castilla  . PORTACATH PLACEMENT  2014    Social History Social History  Substance Use Topics  . Smoking status: Never Smoker  . Smokeless tobacco: Never Used  . Alcohol use Yes    Family History Family History  Problem Relation Age of Onset  . Cancer Mother      uterine   . Cancer Maternal Grandmother     breast  . Cancer Father     prostate    No Known Allergies   REVIEW OF SYSTEMS (Negative unless checked)  Constitutional: [] Weight loss  [] Fever  [] Chills Cardiac: [] Chest pain   [] Chest pressure   [] Palpitations   [] Shortness of breath when laying flat   [] Shortness of breath with exertion. Vascular:  [] Pain in legs with walking   [] Pain in legs at rest  [] History of DVT   [] Phlebitis   [] Swelling in legs   [] Varicose veins   [] Non-healing ulcers Pulmonary:   [] Uses home oxygen   [] Productive cough   [] Hemoptysis   [] Wheeze  [] COPD   [] Asthma Neurologic:  [] Dizziness   [] Seizures   [] History of stroke   [] History of TIA  [] Aphasia   [] Vissual changes   [] Weakness or numbness in arm   [] Weakness or numbness in leg Musculoskeletal:   [] Joint swelling   [] Joint pain   [] Low back pain Hematologic:  [] Easy bruising  [] Easy bleeding   [] Hypercoagulable state   [] Anemic Gastrointestinal:  [] Diarrhea   [] Vomiting  [] Gastroesophageal reflux/heartburn   [] Difficulty swallowing. Genitourinary:  [] Chronic kidney disease   [] Difficult urination  [] Frequent urination   [] Blood in urine Skin:  [] Rashes   [] Ulcers  Psychological:  [] History of anxiety   []  History of major depression.  Physical Examination  Vitals:   03/07/16 1023  BP: 124/78  Pulse: 68  Resp: 15  Weight: 140 lb (63.5 kg)  Height: 5\' 6"  (1.676 m)   Body mass index is 22.6 kg/m. Gen: WD/WN, NAD Head: [place]/AT, No temporalis wasting.  Ear/Nose/Throat: Hearing grossly intact, nares w/o erythema or drainage, poor dentition Eyes: PER, EOMI, sclera nonicteric.  Neck: Supple, no masses.  No bruit or JVD.  Pulmonary:  Good air movement, clear to auscultation bilaterally, no use of accessory muscles.  Cardiac: RRR, normal S1, S2, no Murmurs. Vascular:  Vessel Right Left  Radial Palpable Palpable  Ulnar Palpable Palpable  Brachial Palpable Palpable  Carotid Palpable Palpable   Femoral Palpable Palpable  Popliteal Palpable Palpable  PT Palpable Palpable  DP Palpable Palpable   Gastrointestinal: soft, non-distended. No guarding/no peritoneal signs.  Musculoskeletal: M/S 5/5 throughout.  No deformity or atrophy.  Neurologic: CN 2-12 intact. Pain and light touch intact in extremities.  Symmetrical.  Speech is fluent. Motor exam as listed above. Psychiatric: Judgment intact, Mood & affect appropriate for pt's clinical situation. Dermatologic: No rashes or ulcers noted.  No changes consistent with cellulitis. Lymph : No Cervical lymphadenopathy, no lichenification or skin changes of chronic lymphedema.  CBC Lab Results  Component Value Date   WBC 6.2 09/07/2015   HGB 14.9 09/07/2015   HCT 43.7 09/07/2015   MCV 93.4 09/07/2015   PLT 256 09/07/2015    BMET    Component Value Date/Time   NA 137 09/07/2015 1433   NA 142 01/06/2014 1338   K 3.9 09/07/2015 1433   K 3.8 01/06/2014 1338   CL 102 09/07/2015 1433   CL 106 01/06/2014 1338   CO2 31 09/07/2015 1433   CO2 31 01/06/2014 1338   GLUCOSE 97 09/07/2015 1433   GLUCOSE 80 01/06/2014 1338   BUN 18 09/07/2015 1433   BUN 16 01/06/2014 1338   CREATININE 1.05 (H) 09/07/2015 1433   CREATININE 0.87 08/29/2014 1053   CALCIUM 9.4 09/07/2015 1433   CALCIUM 9.2 01/06/2014 1338   GFRNONAA 58 (L) 09/07/2015 1433   GFRNONAA >60 08/29/2014 1053   GFRAA >60 09/07/2015 1433   GFRAA >60 08/29/2014 1053   CrCl cannot be calculated (Patient's most recent lab result is older than the maximum 21 days allowed.).  COAG No results found for: INR, PROTIME  Radiology No results found.  Assessment/Plan 1. 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  The patient has an asymptomatic left subclavian branch artery aneurys that is less than 2 cm in maximal diameter.  I have discussed the natural history of aneurysms and the small risk of rupture for aneurysm less than 3 cm in size.   However, as these small aneurysms tend to enlarge over time, continued surveillance with ultrasound or CT scan is mandatory.  I have also discussed optimizing medical management with hypertension and lipid control and the importance of abstinence from tobacco.  The patient is also encouraged to exercise a minimum of 30 minutes 4 times a week.  Should the patient develop new onset of arm pain or back pain or signs of peripheral embolization they are instructed to seek medical attention immediately and to alert the physician providing care that they have an aneurysm.  The patient voices their understanding. The patient will return in 12 months with an aortic duplex.   2. Malignant neoplasm of upper-inner quadrant of right female breast, unspecified estrogen receptor status ([place]) Follow up per oncology    Hortencia Pilar, MD  03/07/2016 10:49 AM

## 2016-03-09 ENCOUNTER — Ambulatory Visit: Payer: BC Managed Care – PPO | Admitting: Internal Medicine

## 2016-03-09 ENCOUNTER — Other Ambulatory Visit: Payer: BC Managed Care – PPO

## 2016-03-16 ENCOUNTER — Inpatient Hospital Stay: Payer: BC Managed Care – PPO

## 2016-03-16 ENCOUNTER — Inpatient Hospital Stay: Payer: BC Managed Care – PPO | Attending: Internal Medicine | Admitting: Internal Medicine

## 2016-03-16 VITALS — BP 110/69 | HR 76 | Temp 97.6°F | Resp 20 | Ht 66.0 in | Wt 144.0 lb

## 2016-03-16 DIAGNOSIS — Z9221 Personal history of antineoplastic chemotherapy: Secondary | ICD-10-CM | POA: Insufficient documentation

## 2016-03-16 DIAGNOSIS — Z170 Estrogen receptor positive status [ER+]: Secondary | ICD-10-CM | POA: Diagnosis not present

## 2016-03-16 DIAGNOSIS — Z79899 Other long term (current) drug therapy: Secondary | ICD-10-CM | POA: Insufficient documentation

## 2016-03-16 DIAGNOSIS — Z923 Personal history of irradiation: Secondary | ICD-10-CM | POA: Diagnosis not present

## 2016-03-16 DIAGNOSIS — C50211 Malignant neoplasm of upper-inner quadrant of right female breast: Secondary | ICD-10-CM

## 2016-03-16 LAB — CBC WITH DIFFERENTIAL/PLATELET
Basophils Absolute: 0 10*3/uL (ref 0–0.1)
Basophils Relative: 1 %
EOS ABS: 0.1 10*3/uL (ref 0–0.7)
EOS PCT: 2 %
HCT: 41.8 % (ref 35.0–47.0)
Hemoglobin: 14.1 g/dL (ref 12.0–16.0)
Lymphocytes Relative: 24 %
Lymphs Abs: 1.6 10*3/uL (ref 1.0–3.6)
MCH: 31.3 pg (ref 26.0–34.0)
MCHC: 33.7 g/dL (ref 32.0–36.0)
MCV: 92.8 fL (ref 80.0–100.0)
Monocytes Absolute: 0.7 10*3/uL (ref 0.2–0.9)
Monocytes Relative: 10 %
Neutro Abs: 4.2 10*3/uL (ref 1.4–6.5)
Neutrophils Relative %: 63 %
PLATELETS: 245 10*3/uL (ref 150–440)
RBC: 4.5 MIL/uL (ref 3.80–5.20)
RDW: 12.7 % (ref 11.5–14.5)
WBC: 6.6 10*3/uL (ref 3.6–11.0)

## 2016-03-16 LAB — COMPREHENSIVE METABOLIC PANEL
ALT: 23 U/L (ref 14–54)
ANION GAP: 5 (ref 5–15)
AST: 22 U/L (ref 15–41)
Albumin: 4.5 g/dL (ref 3.5–5.0)
Alkaline Phosphatase: 61 U/L (ref 38–126)
BUN: 26 mg/dL — ABNORMAL HIGH (ref 6–20)
CHLORIDE: 102 mmol/L (ref 101–111)
CO2: 29 mmol/L (ref 22–32)
Calcium: 9.4 mg/dL (ref 8.9–10.3)
Creatinine, Ser: 1.06 mg/dL — ABNORMAL HIGH (ref 0.44–1.00)
GFR calc non Af Amer: 57 mL/min — ABNORMAL LOW (ref >60)
Glucose, Bld: 95 mg/dL (ref 65–99)
POTASSIUM: 4.3 mmol/L (ref 3.5–5.1)
SODIUM: 136 mmol/L (ref 135–145)
Total Bilirubin: 0.5 mg/dL (ref 0.3–1.2)
Total Protein: 7.3 g/dL (ref 6.5–8.1)

## 2016-03-16 NOTE — Assessment & Plan Note (Addendum)
#  Right breast cancer stage I ER/PR negative HER-2/neu positive. On surveillance. Clinically no evidence of recurrence noted. She continues to follow up with Dr. Bary Castilla.   # Reviewed the labs within normal limits. Creatinine at 1.06/ recommended continued hydration.  # We will see the patient back in 12 months with labs.  Patient is due for mammogram in August 2018

## 2016-03-16 NOTE — Progress Notes (Signed)
[facility] OFFICE PROGRESS NOTE  Patient Care Team: Crecencio Mc, MD as PCP - General (Internal Medicine) Robert Bellow, MD (General Surgery) Crecencio Mc, MD (Internal Medicine)   SUMMARY OF ONCOLOGIC HISTORY:  Oncology History   # 2014-RIGHT BREAST CA STAGE I ER/PR NEG; Her-2 NEU POSITIVE;s/p LUMPEC & SLNBx; s/p TCH x 6; s/p RT     Carcinoma of upper-inner quadrant of right breast in female, estrogen receptor positive ([place])   11/22/2012 Initial Diagnosis    Carcinoma of upper-inner quadrant of right breast in female, estrogen receptor positive ([place])        INTERVAL HISTORY:  A very pleasant 60 year old female patient with above history of stage I ER/PR negative HER-2/neu positive breast cancer status post chemotherapy post radiation is here for follow-up.  Appetite is good. She denies any new lumps or bumps. She denies any chest pain or shortness of breath or cough. Denies any nausea vomiting. Denies any headaches.  REVIEW OF SYSTEMS:  A complete 10 point review of system is done which is negative except mentioned above/history of present illness.   PAST MEDICAL HISTORY :  Past Medical History:  Diagnosis Date  . Breast cancer of upper-inner quadrant of right female breast ([place]) July, 2014   Right breast 1.5 cm, node negative, ER -, PR -, Her 2 neu over-expressed. Whole breast radiation.   Adjuvant chemotherapy with Docetaxel/Carboplatin/Herceptin starting on 01/15/13, completed 6 cycles of Docetaxel/Carboplatin on 04/29/13.  Completed 52 weeks of Herceptin on 01/06/14.  . Shingles 2014    PAST SURGICAL HISTORY :   Past Surgical History:  Procedure Laterality Date  . BREAST SURGERY Right December 13 2012   lumpectomy  . COLONOSCOPY  2011  . MASTOPEXY Left Nov 2015   Dr Tula Nakayama  . PORT-A-CATH REMOVAL  02-05-14   Dr Bary Castilla  . PORTACATH PLACEMENT  2014    FAMILY HISTORY :   Family History  Problem Relation Age of Onset  . Cancer Mother     uterine    . Cancer Maternal Grandmother     breast  . Cancer Father     prostate    SOCIAL HISTORY:   Social History  Substance Use Topics  . Smoking status: Never Smoker  . Smokeless tobacco: Never Used  . Alcohol use Yes    ALLERGIES:  has No Known Allergies.  MEDICATIONS:  Current Outpatient Prescriptions  Medication Sig Dispense Refill  . calcium-vitamin D (OSCAL WITH D) 250-125 MG-UNIT tablet Take 1 tablet by mouth daily.    . Multiple Vitamin (MULTIVITAMIN) tablet Take 1 tablet by mouth daily.     No current facility-administered medications for this visit.     PHYSICAL EXAMINATION: ECOG PERFORMANCE STATUS: 0 - Asymptomatic  BP 110/69 (BP Location: Left Arm, Patient Position: Sitting)   Pulse 76   Temp 97.6 F (36.4 C) (Tympanic)   Resp 20   Ht _0  (1.676 m)   Wt 144 lb (65.3 kg)   BMI 23.24 kg/m   Filed Weights   03/16/16 1537  Weight: 144 lb (65.3 kg)    GENERAL: Well-nourished well-developed; Alert, no distress and comfortable.   Alone.  EYES: no pallor or icterus OROPHARYNX: no thrush or ulceration; good dentition  NECK: supple, no masses felt LYMPH:  no palpable lymphadenopathy in the cervical, axillary or inguinal regions LUNGS: clear to auscultation and  No wheeze or crackles HEART/CVS: regular rate & rhythm and no murmurs; No lower extremity edema ABDOMEN:abdomen soft,  non-tender and normal bowel sounds Musculoskeletal:no cyanosis of digits and no clubbing  PSYCH: alert & oriented x 3 with fluent speech NEURO: no focal motor/sensory deficits SKIN:  no rashes or significant lesions   LABORATORY DATA:  I have reviewed the data as listed    Component Value Date/Time   NA 136 03/16/2016 1450   NA 142 01/06/2014 1338   K 4.3 03/16/2016 1450   K 3.8 01/06/2014 1338   CL 102 03/16/2016 1450   CL 106 01/06/2014 1338   CO2 29 03/16/2016 1450   CO2 31 01/06/2014 1338   GLUCOSE 95 03/16/2016 1450   GLUCOSE 80 01/06/2014 1338   BUN 26 (H) 03/16/2016  1450   BUN 16 01/06/2014 1338   CREATININE 1.06 (H) 03/16/2016 1450   CREATININE 0.87 08/29/2014 1053   CALCIUM 9.4 03/16/2016 1450   CALCIUM 9.2 01/06/2014 1338   PROT 7.3 03/16/2016 1450   PROT 7.3 08/29/2014 1053   ALBUMIN 4.5 03/16/2016 1450   ALBUMIN 4.5 08/29/2014 1053   AST 22 03/16/2016 1450   AST 24 08/29/2014 1053   ALT 23 03/16/2016 1450   ALT 20 08/29/2014 1053   ALKPHOS 61 03/16/2016 1450   ALKPHOS 69 08/29/2014 1053   BILITOT 0.5 03/16/2016 1450   BILITOT 0.5 08/29/2014 1053   GFRNONAA 57 (L) 03/16/2016 1450   GFRNONAA >60 08/29/2014 1053   GFRAA >60 03/16/2016 1450   GFRAA >60 08/29/2014 1053    No results found for: SPEP, UPEP  Lab Results  Component Value Date   WBC 6.6 03/16/2016   NEUTROABS 4.2 03/16/2016   HGB 14.1 03/16/2016   HCT 41.8 03/16/2016   MCV 92.8 03/16/2016   PLT 245 03/16/2016      Chemistry      Component Value Date/Time   NA 136 03/16/2016 1450   NA 142 01/06/2014 1338   K 4.3 03/16/2016 1450   K 3.8 01/06/2014 1338   CL 102 03/16/2016 1450   CL 106 01/06/2014 1338   CO2 29 03/16/2016 1450   CO2 31 01/06/2014 1338   BUN 26 (H) 03/16/2016 1450   BUN 16 01/06/2014 1338   CREATININE 1.06 (H) 03/16/2016 1450   CREATININE 0.87 08/29/2014 1053      Component Value Date/Time   CALCIUM 9.4 03/16/2016 1450   CALCIUM 9.2 01/06/2014 1338   ALKPHOS 61 03/16/2016 1450   ALKPHOS 69 08/29/2014 1053   AST 22 03/16/2016 1450   AST 24 08/29/2014 1053   ALT 23 03/16/2016 1450   ALT 20 08/29/2014 1053   BILITOT 0.5 03/16/2016 1450   BILITOT 0.5 08/29/2014 1053        ASSESSMENT & PLAN:   Carcinoma of upper-inner quadrant of right breast in female, estrogen receptor positive ([place]) # Right breast cancer stage I ER/PR negative HER-2/neu positive. On surveillance. Clinically no evidence of recurrence noted. She continues to follow up with Dr. Bary Castilla.   # Reviewed the labs within normal limits. Creatinine at 1.06/ recommended  continued hydration.  # We will see the patient back in 12 months with labs.  Patient is due for mammogram in August 2018       Cammie Sickle, MD 03/16/2016 4:45 PM

## 2016-03-17 LAB — CANCER ANTIGEN 27.29: CA 27.29: 14.6 U/mL (ref 0.0–38.6)

## 2016-03-22 ENCOUNTER — Encounter: Payer: Self-pay | Admitting: Internal Medicine

## 2016-03-22 ENCOUNTER — Ambulatory Visit (INDEPENDENT_AMBULATORY_CARE_PROVIDER_SITE_OTHER): Payer: BC Managed Care – PPO | Admitting: Internal Medicine

## 2016-03-22 ENCOUNTER — Other Ambulatory Visit: Payer: Self-pay | Admitting: Internal Medicine

## 2016-03-22 DIAGNOSIS — Z0000 Encounter for general adult medical examination without abnormal findings: Secondary | ICD-10-CM | POA: Diagnosis not present

## 2016-03-22 DIAGNOSIS — E559 Vitamin D deficiency, unspecified: Secondary | ICD-10-CM

## 2016-03-22 DIAGNOSIS — R5383 Other fatigue: Secondary | ICD-10-CM

## 2016-03-22 DIAGNOSIS — Z13220 Encounter for screening for lipoid disorders: Secondary | ICD-10-CM

## 2016-03-22 MED ORDER — ALPRAZOLAM 0.25 MG PO TABS: 0.2500 mg | ORAL_TABLET | Freq: Every evening | ORAL | 1 refills | Status: DC | PRN

## 2016-03-22 NOTE — Patient Instructions (Addendum)
Good to see you!  Return for fastinglabs at your leisure   O recommend getting the majority of your calcium   through diet rather than supplements given the recent association of calcium supplements with increased coronary artery calcium scores. You need 1200 mg daily of Calcium,  And 1000 IUs od D3 (unless your level is low on future labs)   Try the almond and cashew milks that most grocery stores  now carry  in the dairy  Section>   They are lactose free:  Silk brand Almond Light,  Original formula.  Delicious,  Low carb,  Low cal,  Cholesterol free   We will set up your colonoscopy through Dr Dwyane Luo ofice   The  diet I discussed with you today is the 10 day Green Smoothie Cleansing /Detox Diet by Linden Dolin . available on [address] for around $10.  It does require a blender, (Vita Mix, a electric juicer,  Or a Nutribullet Rx).  This is not a low carb or a weight loss diet,  It is fundamentally a "cleansing" low fat diet that eliminates sugar, gluten, caffeine, alcohol and dairy for 10 days .  What you add back after the initial ten days is entirely up to  you!  You can expect to lose 5 to 10 lbs depending on how strict you are.   I found that  drinking 2 smoothies or juices  daily and keeping one chewable meal (but keep it simple, like baked fish and salad, rice or bok choy) kept me satisfied and kept me from straying  .  You snack primarily on fresh  fruit, egg whites and judicious quantities of nuts.  You can add a  vegetable based protein powder  to any smoothie made with almond milk (nothing with whey , since whey is dairy)  [employer] has a few but  the [employer] has the greatest  selection .  Using frozen fruits is much more convenient and cost effective. You can even find plenty of organic fruit in the frozen fruit section of [employer].  Just thaw what you need for the following day the night before in the refrigerator (to avoid jamming up your machine)   The organic vegan protein  powder I tried  is called Vega" and I found it at [employer] .  It is sugar free. Tastes like crap.  My advice:  Sarina Ser your protein  (eat an egg or two in the am with your smoothie or add soy yogurt for protein ) ,  Don't ruin the taste of your smoothies with protein powder unless you can find one you really love.

## 2016-03-22 NOTE — Progress Notes (Signed)
Pre-visit discussion using our clinic review tool. No additional management support is needed unless otherwise documented below in the visit note.  

## 2016-03-22 NOTE — Progress Notes (Signed)
Patient ID: Karen Washington, female    DOB: Aug 01, 1955  Age: 60 y.o. MRN: [id number]  The patient is here for annual physical examination and management of other chronic and acute problems.  Last seen Dec 2015  Colonoscopy 2011 [facility] at [place]. . Due for follow up discusswd Byrnett instead of  PAP normal 2015    The risk factors are reflected in the social history.  The roster of all physicians providing medical care to patient - is listed in the Snapshot section of the chart.   Home safety : The patient has smoke detectors in the home. They wear seatbelts.  There are no firearms at home. There is no violence in the home.   There is no risks for hepatitis, STDs or HIV. There is no   history of blood transfusion. They have no travel history to infectious disease endemic areas of the world.  The patient has seen their dentist in the last six month. They have seen their eye doctor in the last year. They admit to slight hearing difficulty with regard to whispered voices and some television programs.  They have deferred audiologic testing in the last year.  They do not  have excessive sun exposure. Discussed the need for sun protection: hats, long sleeves and use of sunscreen if there is significant sun exposure.   Diet: the importance of a healthy diet is discussed. They do have a healthy diet.  The benefits of regular aerobic exercise were discussed. She walks 4 times per week ,  20 minutes.   Depression screen: there are no signs or vegative symptoms of depression- irritability, change in appetite, anhedonia, sadness/tearfullness. The following portions of the patient's history were reviewed and updated as appropriate: allergies, current medications, past family history, past medical history,  past surgical history, past social history  and problem list.  Visual acuity was not assessed per patient preference since she has regular follow up with her ophthalmologist. Hearing and body mass  index were assessed and reviewed.   During the course of the visit the patient was educated and counseled about appropriate screening and preventive services including : fall prevention , diabetes screening, nutrition counseling, colorectal cancer screening, and recommended immunizations.    CC: The encounter diagnosis was Encounter for preventive health examination.   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 Repeat US no change.   Increased stress taking care of mother in law. Has been largely unappreciated by her husband's siblings "Being Mortal>" book highly recommended by patient.  .   Early wakeups at 2 am.  Snores.    History Markell has a past medical history of Breast cancer of upper-inner quadrant of right female breast [facility]) (July, 2014) and Shingles (2014).   She has a past surgical history that includes Breast surgery (Right, December 13 2012); Portacath placement (2014); Mastopexy (Left, Nov 2015); Port-a-cath removal (02-05-14); and Colonoscopy (2011).   Her family history includes Cancer in her father, maternal grandmother, and mother.She reports that she has never smoked. She has never used smokeless tobacco. She reports that she drinks alcohol. She reports that she does not use drugs.  Outpatient Medications Prior to Visit  Medication Sig Dispense Refill  . calcium-vitamin D (OSCAL WITH D)  250-125 MG-UNIT tablet Take 1 tablet by mouth daily.    . Multiple Vitamin (MULTIVITAMIN) tablet Take 1 tablet by mouth daily.     No facility-administered medications prior to visit.     Review of Systems  Patient denies headache, fevers, malaise, unintentional weight loss, skin rash, eye pain, sinus congestion and sinus pain, sore throat, dysphagia,   hemoptysis , cough, dyspnea, wheezing, chest pain, palpitations, orthopnea, edema, abdominal pain, nausea, melena, diarrhea, constipation, flank pain, dysuria, hematuria, urinary  Frequency, nocturia, numbness, tingling, seizures,  Focal weakness, Loss of consciousness,  Tremor, insomnia, depression, anxiety, and suicidal ideation.     Objective:  BP 100/78   Pulse 76   Temp 97.8 F (36.6 C) (Oral)   Resp 12   Ht 5' 5.5" (1.664 m)   Wt 143 lb 8 oz (65.1 kg)   SpO2 99%   BMI 23.52 kg/m   Physical Exam   General appearance: alert, cooperative and appears stated age Head: Normocephalic, without obvious abnormality, atraumatic Eyes: conjunctivae/corneas clear. PERRL, EOM's intact. Fundi benign. Ears: normal TM's and external ear canals both ears Nose: Nares normal. Septum midline. Mucosa normal. No drainage or sinus tenderness. Throat: lips, mucosa, and tongue normal; teeth and gums normal Neck: no adenopathy, no carotid bruit, no JVD, supple, symmetrical, trachea midline and thyroid not enlarged, symmetric, no tenderness/mass/nodules Lungs: clear to auscultation bilaterally Breasts: right lumpectomy, otherwise normal appearance, no masses or tenderness Heart: regular rate and rhythm, S1, S2 normal, no murmur, click, rub or gallop Abdomen: soft, non-tender; bowel sounds normal; no masses,  no organomegaly Extremities: extremities normal, atraumatic, no cyanosis or edema Pulses: 2+ and symmetric Skin: Skin color, texture, turgor normal. No rashes or lesions Neurologic: Alert and oriented X 3, normal strength and tone. Normal symmetric reflexes. Normal coordination and gait.      Assessment & Plan:   Problem List Items Addressed This Visit    Encounter for preventive health examination    Annual comprehensive preventive exam was done as well as an evaluation and management of chronic conditions .  During the course of the visit the patient was educated and counseled about appropriate  screening and preventive services including :  diabetes screening, lipid analysis with projected  10 year  risk for CAD , nutrition counseling, breast, cervical and colorectal cancer screening, and recommended immunizations.  Printed recommendations for health maintenance screenings wasn. give         I have changed Ms. Hammett's ALPRAZolam. I am also having her maintain her multivitamin and calcium-vitamin D.  Meds ordered this encounter  Medications  . ALPRAZolam (XANAX) 0.25 MG tablet    Sig: Take 1 tablet (0.25 mg total) by mouth at bedtime as needed for sleep.    Dispense:  30 tablet    Refill:  1    There are no discontinued medications.  Follow-up: No Follow-up on file.   Crecencio Mc, MD

## 2016-03-23 NOTE — Assessment & Plan Note (Signed)
Annual comprehensive preventive exam was done as well as an evaluation and management of chronic conditions .  During the course of the visit the patient was educated and counseled about appropriate screening and preventive services including :  diabetes screening, lipid analysis with projected  10 year  risk for CAD , nutrition counseling, breast, cervical and colorectal cancer screening, and recommended immunizations.  Printed recommendations for health maintenance screenings wasn. give

## 2016-06-08 ENCOUNTER — Telehealth: Payer: Self-pay | Admitting: Internal Medicine

## 2016-06-08 DIAGNOSIS — Z1211 Encounter for screening for malignant neoplasm of colon: Secondary | ICD-10-CM

## 2016-06-08 NOTE — Telephone Encounter (Signed)
Pt would like to have a refereal placed to [facility] for an colonoscopy.. Please advise

## 2016-06-08 NOTE — Telephone Encounter (Signed)
Done. MyChart message sent.

## 2016-06-16 ENCOUNTER — Other Ambulatory Visit: Payer: BC Managed Care – PPO

## 2016-06-21 ENCOUNTER — Other Ambulatory Visit: Payer: BC Managed Care – PPO

## 2016-07-08 ENCOUNTER — Other Ambulatory Visit (INDEPENDENT_AMBULATORY_CARE_PROVIDER_SITE_OTHER): Payer: BC Managed Care – PPO

## 2016-07-08 DIAGNOSIS — Z13220 Encounter for screening for lipoid disorders: Secondary | ICD-10-CM

## 2016-07-08 DIAGNOSIS — R5383 Other fatigue: Secondary | ICD-10-CM

## 2016-07-08 DIAGNOSIS — E559 Vitamin D deficiency, unspecified: Secondary | ICD-10-CM | POA: Diagnosis not present

## 2016-07-08 LAB — LIPID PANEL
CHOLESTEROL: 204 mg/dL — AB (ref 0–200)
HDL: 88.9 mg/dL (ref >39.00)
LDL Cholesterol: 101 mg/dL — ABNORMAL HIGH (ref 0–99)
NonHDL: 115.2
Total CHOL/HDL Ratio: 2
Triglycerides: 70 mg/dL (ref 0.0–149.0)
VLDL: 14 mg/dL (ref 0.0–40.0)

## 2016-07-08 LAB — COMPREHENSIVE METABOLIC PANEL
ALBUMIN: 4.7 g/dL (ref 3.5–5.2)
ALT: 19 U/L (ref 0–35)
AST: 21 U/L (ref 0–37)
Alkaline Phosphatase: 54 U/L (ref 39–117)
BILIRUBIN TOTAL: 0.7 mg/dL (ref 0.2–1.2)
BUN: 21 mg/dL (ref 6–23)
CALCIUM: 9.8 mg/dL (ref 8.4–10.5)
CO2: 29 mEq/L (ref 19–32)
CREATININE: 0.96 mg/dL (ref 0.40–1.20)
Chloride: 104 mEq/L (ref 96–112)
GFR: 63.28 mL/min (ref >60.00)
Glucose, Bld: 96 mg/dL (ref 70–99)
Potassium: 5.2 mEq/L — ABNORMAL HIGH (ref 3.5–5.1)
Sodium: 142 mEq/L (ref 135–145)
Total Protein: 6.9 g/dL (ref 6.0–8.3)

## 2016-07-08 LAB — CBC WITH DIFFERENTIAL/PLATELET
BASOS ABS: 0.1 10*3/uL (ref 0.0–0.1)
Basophils Relative: 1.1 % (ref 0.0–3.0)
Eosinophils Absolute: 0.1 10*3/uL (ref 0.0–0.7)
Eosinophils Relative: 1.3 % (ref 0.0–5.0)
HCT: 42.5 % (ref 36.0–46.0)
Hemoglobin: 14.2 g/dL (ref 12.0–15.0)
LYMPHS ABS: 1.1 10*3/uL (ref 0.7–4.0)
Lymphocytes Relative: 22 % (ref 12.0–46.0)
MCHC: 33.4 g/dL (ref 30.0–36.0)
MCV: 95.3 fl (ref 78.0–100.0)
MONOS PCT: 8.4 % (ref 3.0–12.0)
Monocytes Absolute: 0.4 10*3/uL (ref 0.1–1.0)
NEUTROS ABS: 3.3 10*3/uL (ref 1.4–7.7)
NEUTROS PCT: 67.2 % (ref 43.0–77.0)
PLATELETS: 244 10*3/uL (ref 150.0–400.0)
RBC: 4.46 Mil/uL (ref 3.87–5.11)
RDW: 13 % (ref 11.5–15.5)
WBC: 4.9 10*3/uL (ref 4.0–10.5)

## 2016-07-08 LAB — TSH: TSH: 1.92 u[IU]/mL (ref 0.35–4.50)

## 2016-07-08 LAB — VITAMIN D 25 HYDROXY (VIT D DEFICIENCY, FRACTURES): VITD: 28.59 ng/mL — AB (ref 30.00–100.00)

## 2016-07-09 LAB — HEPATITIS C ANTIBODY: HCV AB: NEGATIVE

## 2016-07-10 ENCOUNTER — Encounter: Payer: Self-pay | Admitting: Internal Medicine

## 2016-09-14 ENCOUNTER — Encounter: Payer: Self-pay | Admitting: Internal Medicine

## 2016-10-27 ENCOUNTER — Other Ambulatory Visit: Payer: Self-pay

## 2016-10-27 DIAGNOSIS — Z171 Estrogen receptor negative status [ER-]: Secondary | ICD-10-CM

## 2016-10-27 DIAGNOSIS — C50211 Malignant neoplasm of upper-inner quadrant of right female breast: Secondary | ICD-10-CM

## 2016-11-01 ENCOUNTER — Other Ambulatory Visit: Payer: Self-pay | Admitting: *Deleted

## 2016-11-01 ENCOUNTER — Inpatient Hospital Stay
Admission: RE | Admit: 2016-11-01 | Discharge: 2016-11-01 | Disposition: A | Discharge status: Home / Self Care | Payer: Self-pay | Source: Ambulatory Visit | Attending: *Deleted | Admitting: *Deleted

## 2016-11-01 DIAGNOSIS — Z9289 Personal history of other medical treatment: Secondary | ICD-10-CM

## 2017-01-04 ENCOUNTER — Ambulatory Visit
Admission: RE | Admit: 2017-01-04 | Discharge: 2017-01-04 | Disposition: A | Discharge status: Home / Self Care | Payer: BC Managed Care – PPO | Source: Ambulatory Visit | Attending: General Surgery | Admitting: General Surgery

## 2017-01-04 DIAGNOSIS — C50211 Malignant neoplasm of upper-inner quadrant of right female breast: Secondary | ICD-10-CM | POA: Diagnosis present

## 2017-01-04 DIAGNOSIS — Z171 Estrogen receptor negative status [ER-]: Secondary | ICD-10-CM

## 2017-01-09 ENCOUNTER — Ambulatory Visit (INDEPENDENT_AMBULATORY_CARE_PROVIDER_SITE_OTHER): Payer: BC Managed Care – PPO | Admitting: General Surgery

## 2017-01-09 ENCOUNTER — Encounter: Payer: Self-pay | Admitting: General Surgery

## 2017-01-09 VITALS — BP 120/80 | HR 78 | Resp 14 | Ht 66.0 in | Wt 138.0 lb

## 2017-01-09 DIAGNOSIS — C50211 Malignant neoplasm of upper-inner quadrant of right female breast: Secondary | ICD-10-CM

## 2017-01-09 DIAGNOSIS — Z171 Estrogen receptor negative status [ER-]: Secondary | ICD-10-CM | POA: Diagnosis not present

## 2017-01-09 NOTE — Progress Notes (Signed)
Patient ID: ERENDIRA CRABTREE, female   DOB: [birth year], 61 y.o.   MRN: [id number]  Chief Complaint  Patient presents with  . Follow-up    HPI Karen Washington is a 61 y.o. female with a history of right breast cancer who presents for a breast evaluation. The most recent mammogram was done on 01/04/2017.  Patient does perform regular self breast checks and gets regular mammograms done.   She reports no new breast problems.   HPI  Past Medical History:  Diagnosis Date  . Breast cancer ([place]) 2014   rright breast ca with lumpectomy, chemo and rad tx  . Breast cancer of upper-inner quadrant of right female breast ([place]) July, 2014   Right breast 1.5 cm, node negative, ER -, PR -, Her 2 neu over-expressed. Whole breast radiation.   Adjuvant chemotherapy with Docetaxel/Carboplatin/Herceptin starting on 01/15/13, completed 6 cycles of Docetaxel/Carboplatin on 04/29/13.  Completed 52 weeks of Herceptin on 01/06/14.  . Personal history of chemotherapy 02/03/2013   right breast ca  . Personal history of radiation therapy 2015   right breast ca  . Shingles 2014    Past Surgical History:  Procedure Laterality Date  . BREAST BIOPSY Right 11/22/2012   INVASIVE MAMMARY CARCINOMA, NO SPECIAL TYPE, DCIS  . BREAST LUMPECTOMY Right 12/13/2012   invasive mammary carcinoma and DCIS. Clear margins  . BREAST SURGERY Right December 13 2012   lumpectomy  . COLONOSCOPY  2011  . MASTOPEXY Left Nov 2015   Dr Tula Nakayama  . PORT-A-CATH REMOVAL  02-05-14   Dr Bary Castilla  . PORTACATH PLACEMENT  2014  . REDUCTION MAMMAPLASTY Left 2015   mastopexy    Family History  Problem Relation Age of Onset  . Cancer Mother        uterine   . Breast cancer Maternal Grandmother   . Cancer Father        prostate    Social History Social History  Substance Use Topics  . Smoking status: Never Smoker  . Smokeless tobacco: Never Used  . Alcohol use Yes    No Known Allergies  Current Outpatient Prescriptions  Medication Sig Dispense  Refill  . ALPRAZolam (XANAX) 0.25 MG tablet Take 1 tablet (0.25 mg total) by mouth at bedtime as needed for sleep. 30 tablet 1  . calcium-vitamin D (OSCAL WITH D) 250-125 MG-UNIT tablet Take 1 tablet by mouth daily.     No current facility-administered medications for this visit.     Review of Systems Review of Systems  Constitutional: Negative.   Respiratory: Negative.   Cardiovascular: Negative.     Blood pressure 120/80, pulse 78, resp. rate 14, height 5\' 6"  (1.676 m), weight 138 lb (62.6 kg).  Physical Exam Physical Exam  Constitutional: She is oriented to person, place, and time. She appears well-developed and well-nourished.  Eyes: Conjunctivae are normal. No scleral icterus.  Neck: Neck supple.  Cardiovascular: Normal rate, regular rhythm and normal heart sounds.   Pulmonary/Chest: Effort normal and breath sounds normal. Right breast exhibits no inverted nipple, no mass, no nipple discharge, no skin change and no tenderness. Left breast exhibits no inverted nipple, no mass, no nipple discharge, no skin change and no tenderness.    Lymphadenopathy:    She has no cervical adenopathy.    She has no axillary adenopathy.  Neurological: She is alert and oriented to person, place, and time.  Skin: Skin is warm and dry.    Data Reviewed 01/04/2017 bilateral diagnostic mammograms were reviewed  and compared to previous studies. No interval change. BI-RADS-2.  Assessment    No evidence of recurrent disease.   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  Plan        The patient has been asked to return to the office in one year with a bilateral diagnostic mammogram. The patient is aware to call back for any questions or concerns.  HPI, Physical Exam, Assessment and Plan have been scribed under the direction and in the presence of Robert Bellow, MD  Karen Living, LPN  I have completed the exam and reviewed the above documentation  for accuracy and completeness.  I agree with the above.  Haematologist has been used and any errors in dictation or transcription are unintentional.  Hervey Ard, M.D., F.A.C.S.   Robert Bellow 01/09/2017, 10:57 AM

## 2017-01-09 NOTE — Patient Instructions (Signed)
The patient has been asked to return to the office in one year with a bilateral diagnostic mammogram.The patient is aware to call back for any questions or concerns. 

## 2017-02-27 ENCOUNTER — Ambulatory Visit (INDEPENDENT_AMBULATORY_CARE_PROVIDER_SITE_OTHER): Payer: BC Managed Care – PPO

## 2017-02-27 ENCOUNTER — Encounter (INDEPENDENT_AMBULATORY_CARE_PROVIDER_SITE_OTHER): Payer: Self-pay | Admitting: Vascular Surgery

## 2017-02-27 ENCOUNTER — Ambulatory Visit (INDEPENDENT_AMBULATORY_CARE_PROVIDER_SITE_OTHER): Payer: BC Managed Care – PPO | Admitting: Vascular Surgery

## 2017-02-27 VITALS — BP 126/69 | HR 65 | Resp 17 | Ht 66.0 in | Wt 136.8 lb

## 2017-02-27 DIAGNOSIS — C50211 Malignant neoplasm of upper-inner quadrant of right female breast: Secondary | ICD-10-CM | POA: Diagnosis not present

## 2017-02-27 DIAGNOSIS — I721 Aneurysm of artery of upper extremity: Secondary | ICD-10-CM | POA: Diagnosis not present

## 2017-02-27 DIAGNOSIS — Z170 Estrogen receptor positive status [ER+]: Secondary | ICD-10-CM | POA: Diagnosis not present

## 2017-02-27 NOTE — Progress Notes (Signed)
MRN : [id number]  Karen Washington is a 61 y.o. 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 The aneurysm is followed by ultrasound.   The patient denies pain in the left arm or changes to the left fingers.  No ulcers of the hand have occurred. The patient denies amaurosis fugax. There is no recent history of TIA symptoms or focal motor deficits. There is no prior documented CVA.  The patient is taking enteric-coated aspirin 81 mg daily.  There is no history of migraine headaches. There is no history of seizures.  The patient has a history of coronary artery disease, no recent episodes of angina or shortness of breath. The patient denies PAD or claudication symptoms. There is a history of hyperlipidemia which is being treated with a statin.    Left arm duplex done today shows the aneurysm is 0.32 cm.  No change compared to last study  No outpatient prescriptions have been marked as taking for the 02/27/17 encounter (Appointment) with Delana Meyer, Dolores Lory, MD.    Past Medical History:  Diagnosis Date  . Breast cancer ([place]) 2014   rright breast ca with lumpectomy, chemo and rad tx  . Breast cancer of upper-inner quadrant of right female breast ([place]) July, 2014   Right breast 1.5 cm, node negative, ER -, PR -, Her 2 neu over-expressed. Whole breast radiation.   Adjuvant chemotherapy with Docetaxel/Carboplatin/Herceptin starting on 01/15/13, completed 6 cycles of Docetaxel/Carboplatin on 04/29/13.  Completed 52 weeks of Herceptin on 01/06/14.  . Personal history of chemotherapy 02/03/2013   right breast ca  . Personal history of radiation therapy 2015   right breast ca  . Shingles 2014    Past Surgical History:  Procedure Laterality Date  . BREAST BIOPSY Right 11/22/2012   INVASIVE MAMMARY CARCINOMA, NO  SPECIAL TYPE, DCIS  . BREAST LUMPECTOMY Right 12/13/2012   invasive mammary carcinoma and DCIS. Clear margins  . BREAST SURGERY Right December 13 2012   lumpectomy  . COLONOSCOPY  2011  . MASTOPEXY Left Nov 2015   Dr Tula Nakayama  . PORT-A-CATH REMOVAL  02-05-14   Dr Bary Castilla  . PORTACATH PLACEMENT  2014  . REDUCTION MAMMAPLASTY Left 2015   mastopexy    Social History Social History  Substance Use Topics  . Smoking status: Never Smoker  . Smokeless tobacco: Never Used  . Alcohol use Yes    Family History Family History  Problem Relation Age of Onset  . Cancer Mother        uterine   . Breast cancer Maternal Grandmother   . Cancer Father        prostate    No Known Allergies   REVIEW OF SYSTEMS (Negative unless checked)  Constitutional: [] Weight loss  [] Fever  [] Chills Cardiac: [] Chest pain   [] Chest pressure   [] Palpitations   [] Shortness of breath when laying flat   [] Shortness of breath with exertion. Vascular:  [] Pain in legs with walking   [] Pain in legs at rest  [] History of DVT   [] Phlebitis   [] Swelling in legs   [] Varicose veins   [] Non-healing ulcers Pulmonary:   [] Uses home oxygen   [] Productive cough   [] Hemoptysis   [] Wheeze  [] COPD   [] Asthma Neurologic:  [] Dizziness   [] Seizures   [] History of stroke   []   History of TIA  [] Aphasia   [] Vissual changes   [] Weakness or numbness in arm   [] Weakness or numbness in leg Musculoskeletal:   [] Joint swelling   [] Joint pain   [] Low back pain Hematologic:  [] Easy bruising  [] Easy bleeding   [] Hypercoagulable state   [] Anemic Gastrointestinal:  [] Diarrhea   [] Vomiting  [] Gastroesophageal reflux/heartburn   [] Difficulty swallowing. Genitourinary:  [] Chronic kidney disease   [] Difficult urination  [] Frequent urination   [] Blood in urine Skin:  [] Rashes   [] Ulcers  Psychological:  [] History of anxiety   []  History of major depression.  Physical Examination  There were no vitals filed for this visit. There is no height or weight on  file to calculate BMI. Gen: WD/WN, NAD Head: [place]/AT, No temporalis wasting.  Ear/Nose/Throat: Hearing grossly intact, nares w/o erythema or drainage Eyes: PER, EOMI, sclera nonicteric.  Neck: Supple, no large masses.   Pulmonary:  Good air movement, no audible wheezing bilaterally, no use of accessory muscles.  Cardiac: RRR, no JVD Vascular: small pulsatile mass left supraclavicular fossa, nontender Vessel Right Left  Radial Palpable Palpable  Ulnar Palpable Palpable  Brachial Palpable Palpable  Carotid Palpable Palpable  Gastrointestinal: Non-distended. No guarding/no peritoneal signs.  Musculoskeletal: M/S 5/5 throughout.  No deformity or atrophy.  Neurologic: CN 2-12 intact. Symmetrical.  Speech is fluent. Motor exam as listed above. Psychiatric: Judgment intact, Mood & affect appropriate for pt's clinical situation. Dermatologic: No rashes or ulcers noted.  No changes consistent with cellulitis. Lymph : No lichenification or skin changes of chronic lymphedema.  CBC Lab Results  Component Value Date   WBC 4.9 07/08/2016   HGB 14.2 07/08/2016   HCT 42.5 07/08/2016   MCV 95.3 07/08/2016   PLT 244.0 07/08/2016    BMET    Component Value Date/Time   NA 142 07/08/2016 0928   NA 142 01/06/2014 1338   K 5.2 (H) 07/08/2016 0928   K 3.8 01/06/2014 1338   CL 104 07/08/2016 0928   CL 106 01/06/2014 1338   CO2 29 07/08/2016 0928   CO2 31 01/06/2014 1338   GLUCOSE 96 07/08/2016 0928   GLUCOSE 80 01/06/2014 1338   BUN 21 07/08/2016 0928   BUN 16 01/06/2014 1338   CREATININE 0.96 07/08/2016 0928   CREATININE 0.87 08/29/2014 1053   CALCIUM 9.8 07/08/2016 0928   CALCIUM 9.2 01/06/2014 1338   GFRNONAA 57 (L) 03/16/2016 1450   GFRNONAA >60 08/29/2014 1053   GFRAA >60 03/16/2016 1450   GFRAA >60 08/29/2014 1053   CrCl cannot be calculated (Patient's most recent lab result is older than the maximum 21 days allowed.).  COAG No results found for: INR, PROTIME  Radiology No  results found.   Assessment/Plan 1. 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  Duplex ultrasound shows the aneurysm is small and does not appear to have grown.  Continue antiplatelet therapy as prescribed Continue management of all comorbidities Healthy heart diet,  encouraged exercise at least 4 times per week  Follow up in 12 months with duplex ultrasound and physical exam     2. Carcinoma of upper-inner quadrant of right breast in female, estrogen receptor positive ([place]) Continue to follow with General surgery and oncology    Hortencia Pilar, MD  02/27/2017 9:30 AM

## 2017-03-16 ENCOUNTER — Inpatient Hospital Stay: Payer: BC Managed Care – PPO | Attending: Internal Medicine

## 2017-03-16 ENCOUNTER — Inpatient Hospital Stay (HOSPITAL_BASED_OUTPATIENT_CLINIC_OR_DEPARTMENT_OTHER): Payer: BC Managed Care – PPO | Admitting: Internal Medicine

## 2017-03-16 ENCOUNTER — Encounter: Payer: Self-pay | Admitting: Internal Medicine

## 2017-03-16 VITALS — BP 117/77 | HR 85 | Temp 98.6°F | Resp 16 | Wt 135.0 lb

## 2017-03-16 DIAGNOSIS — Z923 Personal history of irradiation: Secondary | ICD-10-CM

## 2017-03-16 DIAGNOSIS — Z171 Estrogen receptor negative status [ER-]: Secondary | ICD-10-CM

## 2017-03-16 DIAGNOSIS — C50211 Malignant neoplasm of upper-inner quadrant of right female breast: Secondary | ICD-10-CM

## 2017-03-16 DIAGNOSIS — Z170 Estrogen receptor positive status [ER+]: Secondary | ICD-10-CM

## 2017-03-16 DIAGNOSIS — Z853 Personal history of malignant neoplasm of breast: Secondary | ICD-10-CM | POA: Insufficient documentation

## 2017-03-16 DIAGNOSIS — Z9221 Personal history of antineoplastic chemotherapy: Secondary | ICD-10-CM

## 2017-03-16 DIAGNOSIS — Z79899 Other long term (current) drug therapy: Secondary | ICD-10-CM | POA: Insufficient documentation

## 2017-03-16 LAB — CBC WITH DIFFERENTIAL/PLATELET
BASOS ABS: 0 10*3/uL (ref 0–0.1)
BASOS PCT: 1 %
Eosinophils Absolute: 0 10*3/uL (ref 0–0.7)
Eosinophils Relative: 1 %
HCT: 41.1 % (ref 35.0–47.0)
Hemoglobin: 13.9 g/dL (ref 12.0–16.0)
Lymphocytes Relative: 13 %
Lymphs Abs: 0.8 10*3/uL — ABNORMAL LOW (ref 1.0–3.6)
MCH: 31.9 pg (ref 26.0–34.0)
MCHC: 33.8 g/dL (ref 32.0–36.0)
MCV: 94.4 fL (ref 80.0–100.0)
MONO ABS: 0.5 10*3/uL (ref 0.2–0.9)
Monocytes Relative: 7 %
NEUTROS ABS: 5.3 10*3/uL (ref 1.4–6.5)
Neutrophils Relative %: 78 %
Platelets: 252 10*3/uL (ref 150–440)
RBC: 4.35 MIL/uL (ref 3.80–5.20)
RDW: 12.6 % (ref 11.5–14.5)
WBC: 6.7 10*3/uL (ref 3.6–11.0)

## 2017-03-16 LAB — COMPREHENSIVE METABOLIC PANEL
ALBUMIN: 4.4 g/dL (ref 3.5–5.0)
ALT: 20 U/L (ref 14–54)
AST: 25 U/L (ref 15–41)
Alkaline Phosphatase: 48 U/L (ref 38–126)
Anion gap: 8 (ref 5–15)
BUN: 22 mg/dL — AB (ref 6–20)
CHLORIDE: 101 mmol/L (ref 101–111)
CO2: 28 mmol/L (ref 22–32)
Calcium: 9.4 mg/dL (ref 8.9–10.3)
Creatinine, Ser: 0.95 mg/dL (ref 0.44–1.00)
GFR calc Af Amer: >60 mL/min (ref >60)
GFR calc non Af Amer: >60 mL/min (ref >60)
GLUCOSE: 93 mg/dL (ref 65–99)
POTASSIUM: 4.7 mmol/L (ref 3.5–5.1)
SODIUM: 137 mmol/L (ref 135–145)
Total Bilirubin: 0.8 mg/dL (ref 0.3–1.2)
Total Protein: 6.8 g/dL (ref 6.5–8.1)

## 2017-03-16 NOTE — Progress Notes (Signed)
[facility] OFFICE PROGRESS NOTE  Patient Care Team: Crecencio Mc, MD as PCP - General (Internal Medicine) Bary Castilla, Forest Gleason, MD (General Surgery) Crecencio Mc, MD (Internal Medicine)   SUMMARY OF ONCOLOGIC HISTORY:  Oncology History   # 2014-RIGHT BREAST CA STAGE I ER/PR NEG; Her-2 NEU POSITIVE;s/p LUMPEC & SLNBx; s/p TCH x 6; s/p RT     Carcinoma of upper-inner quadrant of right breast in female, estrogen receptor positive ([place])      INTERVAL HISTORY:  A very pleasant 61 year old female patient with above history of stage I ER/PR negative HER-2/neu positive breast cancer status post chemotherapy post radiation [2014] is here for follow-up.  Patient had a recent mammogram evaluation with Dr. Charissa Bash- no concerns for recurrence as per patient.  Appetite is good. She denies any new lumps or bumps. She denies any chest pain or shortness of breath or cough. Denies any nausea vomiting. Denies any headaches.  REVIEW OF SYSTEMS:  A complete 10 point review of system is done which is negative except mentioned above/history of present illness.   PAST MEDICAL HISTORY :  Past Medical History:  Diagnosis Date  . Breast cancer ([place]) 2014   rright breast ca with lumpectomy, chemo and rad tx  . Breast cancer of upper-inner quadrant of right female breast ([place]) July, 2014   Right breast 1.5 cm, node negative, ER -, PR -, Her 2 neu over-expressed. Whole breast radiation.   Adjuvant chemotherapy with Docetaxel/Carboplatin/Herceptin starting on 01/15/13, completed 6 cycles of Docetaxel/Carboplatin on 04/29/13.  Completed 52 weeks of Herceptin on 01/06/14.  . Personal history of chemotherapy 02/03/2013   right breast ca  . Personal history of radiation therapy 2015   right breast ca  . Shingles 2014    PAST SURGICAL HISTORY :   Past Surgical History:  Procedure Laterality Date  . BREAST BIOPSY Right 11/22/2012   INVASIVE MAMMARY CARCINOMA, NO SPECIAL TYPE, DCIS  .  BREAST LUMPECTOMY Right 12/13/2012   invasive mammary carcinoma and DCIS. Clear margins  . BREAST SURGERY Right December 13 2012   lumpectomy  . COLONOSCOPY  2011  . MASTOPEXY Left Nov 2015   Dr Tula Nakayama  . PORT-A-CATH REMOVAL  02-05-14   Dr Bary Castilla  . PORTACATH PLACEMENT  2014  . REDUCTION MAMMAPLASTY Left 2015   mastopexy    FAMILY HISTORY :   Family History  Problem Relation Age of Onset  . Cancer Mother        uterine   . Breast cancer Maternal Grandmother   . Cancer Father        prostate    SOCIAL HISTORY:   Social History   Tobacco Use  . Smoking status: Never Smoker  . Smokeless tobacco: Never Used  Substance Use Topics  . Alcohol use: Yes  . Drug use: No    ALLERGIES:  has No Known Allergies.  MEDICATIONS:  Current Outpatient Medications  Medication Sig Dispense Refill  . calcium-vitamin D (OSCAL WITH D) 250-125 MG-UNIT tablet Take 1 tablet by mouth daily.    Marland Kitchen ALPRAZolam (XANAX) 0.25 MG tablet Take 1 tablet (0.25 mg total) by mouth at bedtime as needed for sleep. (Patient not taking: Reported on 03/16/2017) 30 tablet 1   No current facility-administered medications for this visit.     PHYSICAL EXAMINATION: ECOG PERFORMANCE STATUS: 0 - Asymptomatic  BP 117/77 (BP Location: Left Arm, Patient Position: Sitting)   Pulse 85   Temp 98.6 F (37 C) (Tympanic)  Resp 16   Wt 135 lb (61.2 kg)   BMI 21.79 kg/m   Filed Weights   03/16/17 1105  Weight: 135 lb (61.2 kg)    GENERAL: Well-nourished well-developed; Alert, no distress and comfortable.   Alone.  EYES: no pallor or icterus OROPHARYNX: no thrush or ulceration; good dentition  NECK: supple, no masses felt LYMPH:  no palpable lymphadenopathy in the cervical, axillary or inguinal regions LUNGS: clear to auscultation and  No wheeze or crackles HEART/CVS: regular rate & rhythm and no murmurs; No lower extremity edema ABDOMEN:abdomen soft, non-tender and normal bowel sounds Musculoskeletal:no cyanosis of  digits and no clubbing  PSYCH: alert & oriented x 3 with fluent speech NEURO: no focal motor/sensory deficits SKIN:  no rashes or significant lesions   LABORATORY DATA:  I have reviewed the data as listed    Component Value Date/Time   NA 137 03/16/2017 1035   NA 142 01/06/2014 1338   K 4.7 03/16/2017 1035   K 3.8 01/06/2014 1338   CL 101 03/16/2017 1035   CL 106 01/06/2014 1338   CO2 28 03/16/2017 1035   CO2 31 01/06/2014 1338   GLUCOSE 93 03/16/2017 1035   GLUCOSE 80 01/06/2014 1338   BUN 22 (H) 03/16/2017 1035   BUN 16 01/06/2014 1338   CREATININE 0.95 03/16/2017 1035   CREATININE 0.87 08/29/2014 1053   CALCIUM 9.4 03/16/2017 1035   CALCIUM 9.2 01/06/2014 1338   PROT 6.8 03/16/2017 1035   PROT 7.3 08/29/2014 1053   ALBUMIN 4.4 03/16/2017 1035   ALBUMIN 4.5 08/29/2014 1053   AST 25 03/16/2017 1035   AST 24 08/29/2014 1053   ALT 20 03/16/2017 1035   ALT 20 08/29/2014 1053   ALKPHOS 48 03/16/2017 1035   ALKPHOS 69 08/29/2014 1053   BILITOT 0.8 03/16/2017 1035   BILITOT 0.5 08/29/2014 1053   GFRNONAA >60 03/16/2017 1035   GFRNONAA >60 08/29/2014 1053   GFRAA >60 03/16/2017 1035   GFRAA >60 08/29/2014 1053    No results found for: SPEP, UPEP  Lab Results  Component Value Date   WBC 6.7 03/16/2017   NEUTROABS 5.3 03/16/2017   HGB 13.9 03/16/2017   HCT 41.1 03/16/2017   MCV 94.4 03/16/2017   PLT 252 03/16/2017      Chemistry      Component Value Date/Time   NA 137 03/16/2017 1035   NA 142 01/06/2014 1338   K 4.7 03/16/2017 1035   K 3.8 01/06/2014 1338   CL 101 03/16/2017 1035   CL 106 01/06/2014 1338   CO2 28 03/16/2017 1035   CO2 31 01/06/2014 1338   BUN 22 (H) 03/16/2017 1035   BUN 16 01/06/2014 1338   CREATININE 0.95 03/16/2017 1035   CREATININE 0.87 08/29/2014 1053      Component Value Date/Time   CALCIUM 9.4 03/16/2017 1035   CALCIUM 9.2 01/06/2014 1338   ALKPHOS 48 03/16/2017 1035   ALKPHOS 69 08/29/2014 1053   AST 25 03/16/2017 1035    AST 24 08/29/2014 1053   ALT 20 03/16/2017 1035   ALT 20 08/29/2014 1053   BILITOT 0.8 03/16/2017 1035   BILITOT 0.5 08/29/2014 1053        ASSESSMENT & PLAN:   Carcinoma of upper-inner quadrant of right breast in female, estrogen receptor positive ([place]) # Right breast cancer stage I ER/PR negative HER-2/neu positive. On surveillance. Clinically no evidence of recurrence noted. She continues to follow up with Dr. Bary Castilla.   # We  will see the patient back in 12 months with labs-        Cammie Sickle, MD 03/19/2017 11:58 AM

## 2017-03-16 NOTE — Assessment & Plan Note (Addendum)
#  Right breast cancer stage I ER/PR negative HER-2/neu positive. On surveillance. Clinically no evidence of recurrence noted. She continues to follow up with Dr. Bary Castilla.   # Follow-up in 12 months with labs [discussed follow-up; interested].

## 2017-03-17 LAB — CANCER ANTIGEN 27.29: CAN 27.29: 15.3 U/mL (ref 0.0–38.6)

## 2017-07-05 IMAGING — CT CT ANGIO NECK
2 of 7 series · 9 of 33 positions shown · IV contrast (APPLIED)
Comparison: None.

CLINICAL DATA: Pulsatile mass in the left supraclavicular fossa
noted on vascular surgery clinic sonography.

EXAM:
CT ANGIOGRAPHY NECK
TECHNIQUE: Multidetector CT imaging of the neck was performed using the
standard protocol during bolus administration of intravenous
contrast. Multiplanar CT image reconstructions and MIPs were
obtained to evaluate the vascular anatomy. Carotid stenosis
measurements (when applicable) are obtained utilizing NASCET
criteria, using the distal internal carotid diameter as the
denominator.
CONTRAST:  75mL OMNIPAQUE IOHEXOL 350 MG/ML SOLN

[Series 4: cta neck · axial · 0.87mm/px · z∈[-234,-106]mm · 3 of 171 slices shown]
[im 43/171  soft-tissue]
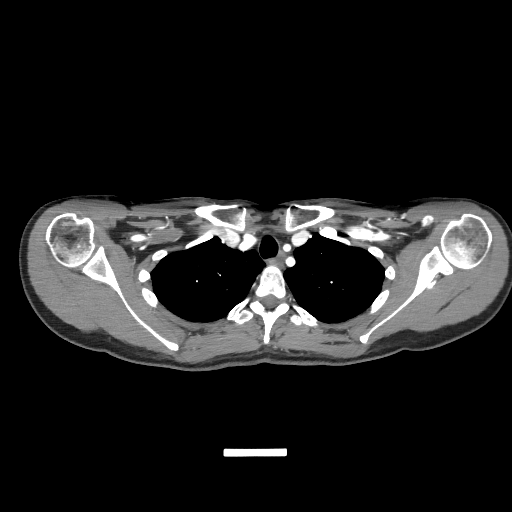
[im 86/171  soft-tissue]
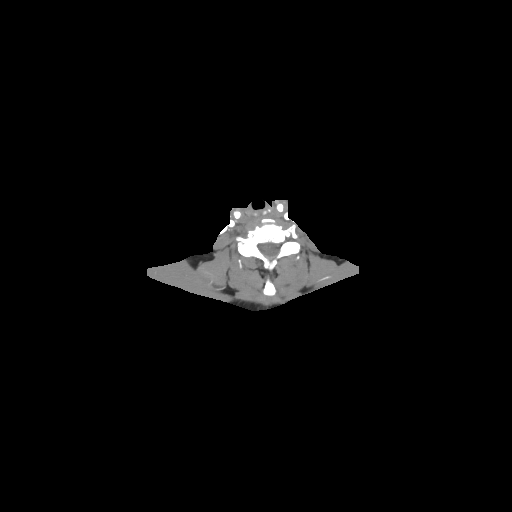
[im 128/171  soft-tissue]
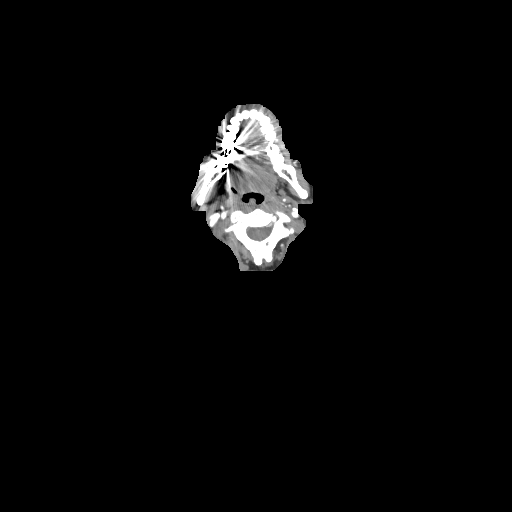

[Series 5: ax thin · axial · 0.51mm/px · z∈[-263,-79]mm · 6 of 258 slices shown]
[im 37/258  soft-tissue]
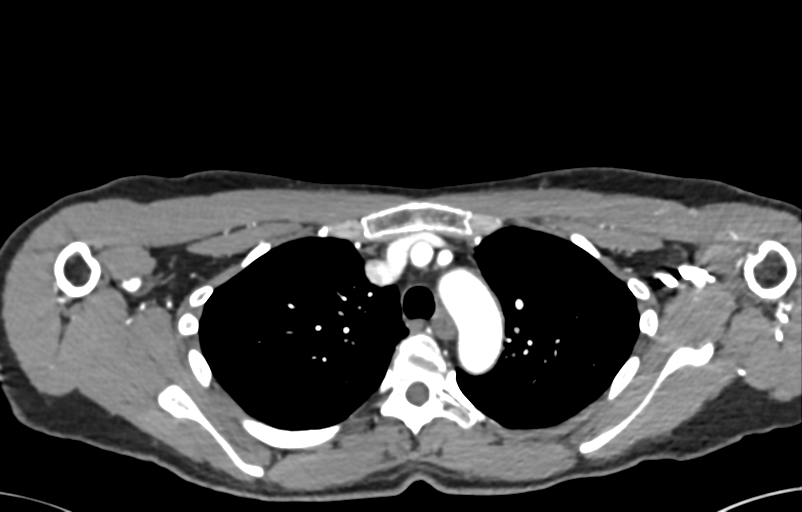
[im 74/258  bone]
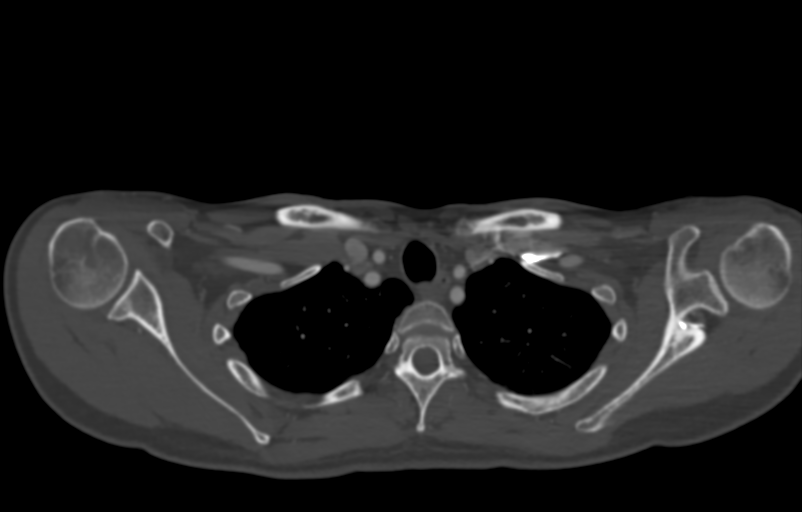
[im 111/258  soft-tissue]
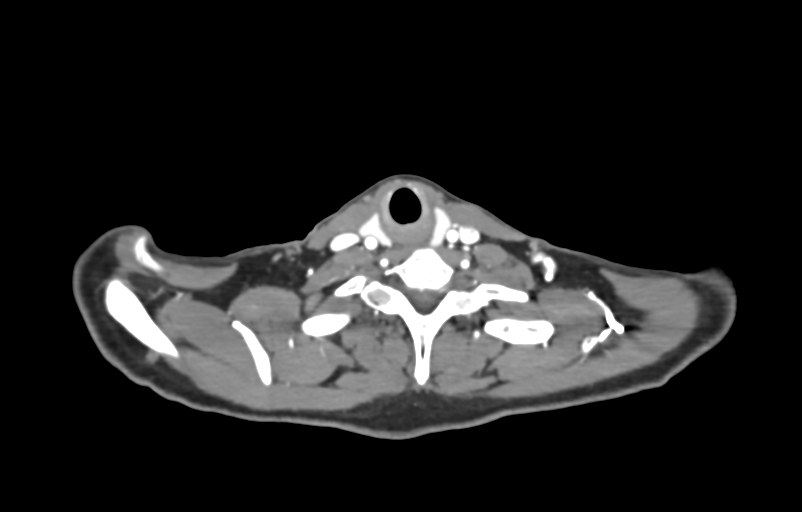
[im 147/258  bone]
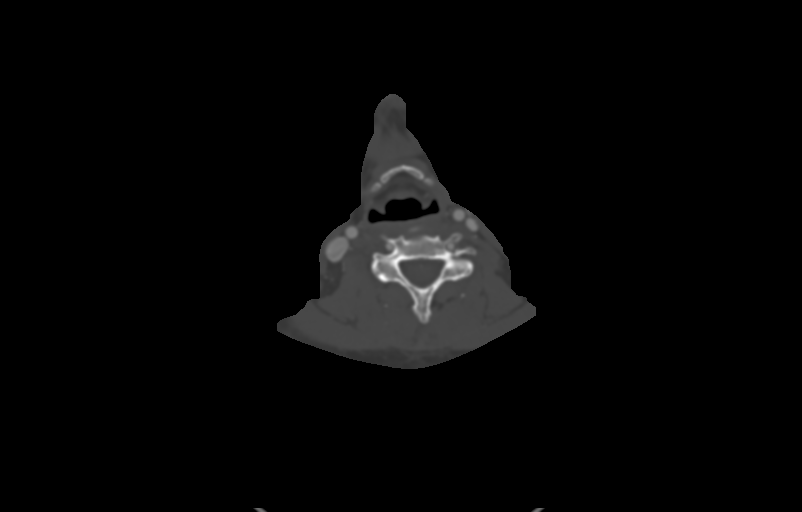
[im 184/258  soft-tissue]
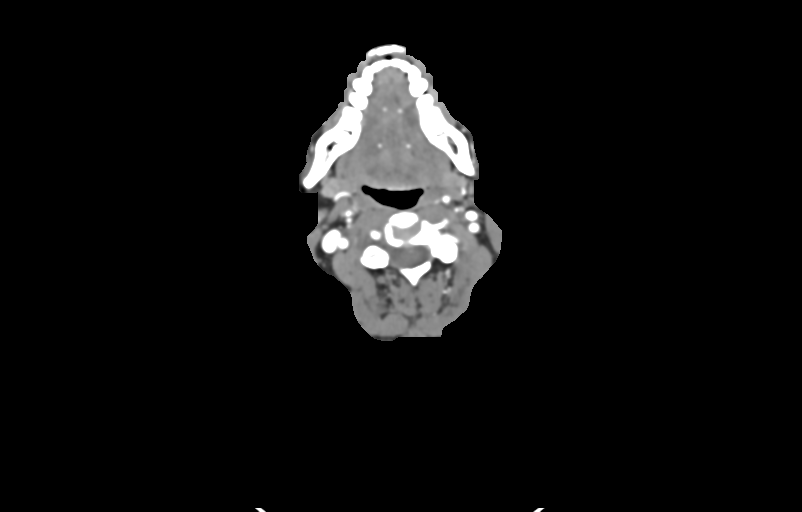
[im 221/258  bone]
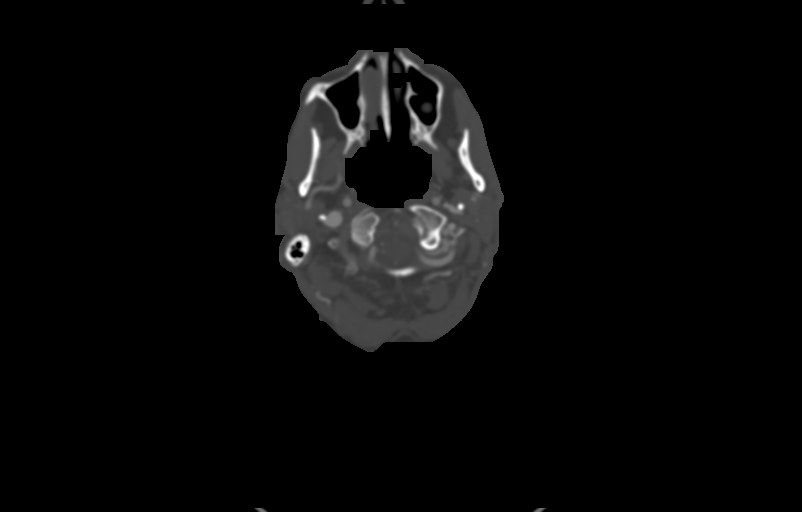

[9 of 33 positions shown; findings below may reference images not displayed]

FINDINGS: The pulsatile mass in the left supraclavicular fossa represents a 13
x 9 mm fusiform aneurysm of a thyrocervical trunk branch, the
transverse cervical artery. There is no luminal thrombosis or
surrounding hematoma/inflammation. No other aneurysms are seen.

Aortic arch: No aneurysm or dissection.  No atheromatous changes.

Right carotid system: No atherosclerosis, stenosis, or evidence of
vasculopathy.

Left carotid system: No atherosclerosis, stenosis, or evidence of
vasculopathy.

Vertebral arteries:Widely patent subclavian arteries. Symmetric and
widely patent vertebral arteries. No evidence of vasculopathy.

Skeleton: No significant finding.

Other neck: No incidental soft tissue mass or adenopathy.
IMPRESSION: 9 mm diameter, 13 mm long fusiform aneurysm of the left transverse
cervical artery, located in the supraclavicular fat.

## 2017-10-31 ENCOUNTER — Encounter: Payer: Self-pay | Admitting: Internal Medicine

## 2017-10-31 ENCOUNTER — Ambulatory Visit (INDEPENDENT_AMBULATORY_CARE_PROVIDER_SITE_OTHER): Payer: BC Managed Care – PPO | Admitting: Internal Medicine

## 2017-10-31 ENCOUNTER — Other Ambulatory Visit (HOSPITAL_COMMUNITY)
Admission: RE | Admit: 2017-10-31 | Discharge: 2017-10-31 | Disposition: A | Discharge status: Home / Self Care | Payer: BC Managed Care – PPO | Source: Ambulatory Visit | Attending: Internal Medicine | Admitting: Internal Medicine

## 2017-10-31 VITALS — BP 102/74 | HR 73 | Temp 98.4°F | Resp 14 | Ht 66.0 in | Wt 131.8 lb

## 2017-10-31 DIAGNOSIS — Z124 Encounter for screening for malignant neoplasm of cervix: Secondary | ICD-10-CM | POA: Insufficient documentation

## 2017-10-31 DIAGNOSIS — Z170 Estrogen receptor positive status [ER+]: Secondary | ICD-10-CM | POA: Diagnosis not present

## 2017-10-31 DIAGNOSIS — Z0000 Encounter for general adult medical examination without abnormal findings: Secondary | ICD-10-CM | POA: Diagnosis not present

## 2017-10-31 DIAGNOSIS — C50211 Malignant neoplasm of upper-inner quadrant of right female breast: Secondary | ICD-10-CM

## 2017-10-31 DIAGNOSIS — R5383 Other fatigue: Secondary | ICD-10-CM | POA: Diagnosis not present

## 2017-10-31 MED ORDER — ZOSTER VAC RECOMB ADJUVANTED 50 MCG/0.5ML IM SUSR: 0.5000 mL | Freq: Once | INTRAMUSCULAR | 1 refills | Status: AC

## 2017-10-31 NOTE — Assessment & Plan Note (Signed)
Annual comprehensive preventive exam was done as well as an evaluation and management of chronic conditions .  During the course of the visit the patient was educated and counseled about appropriate screening and preventive services including :  diabetes screening, lipid analysis with projected  10 year  risk for CAD , nutrition counseling, breast, cervical and colorectal cancer screening, and recommended immunizations.  Printed recommendations for health maintenance screenings was given 

## 2017-10-31 NOTE — Assessment & Plan Note (Signed)
No recurrence since diagnosis and lumpectmy and treatment.  Continue annual mammogram and oncology follow up

## 2017-10-31 NOTE — Patient Instructions (Signed)
Health Maintenance for Postmenopausal Women Menopause is a normal process in which your reproductive ability comes to an end. This process happens gradually over a span of months to years, usually between the ages of 22 and 9. Menopause is complete when you have missed 12 consecutive menstrual periods. It is important to talk with your health care provider about some of the most common conditions that affect postmenopausal women, such as heart disease, cancer, and bone loss (osteoporosis). Adopting a healthy lifestyle and getting preventive care can help to promote your health and wellness. Those actions can also lower your chances of developing some of these common conditions. What should I know about menopause? During menopause, you may experience a number of symptoms, such as:  Moderate-to-severe hot flashes.  Night sweats.  Decrease in sex drive.  Mood swings.  Headaches.  Tiredness.  Irritability.  Memory problems.  Insomnia.  Choosing to treat or not to treat menopausal changes is an individual decision that you make with your health care provider. What should I know about hormone replacement therapy and supplements? Hormone therapy products are effective for treating symptoms that are associated with menopause, such as hot flashes and night sweats. Hormone replacement carries certain risks, especially as you become older. If you are thinking about using estrogen or estrogen with progestin treatments, discuss the benefits and risks with your health care provider. What should I know about heart disease and stroke? Heart disease, heart attack, and stroke become more likely as you age. This may be due, in part, to the hormonal changes that your body experiences during menopause. These can affect how your body processes dietary fats, triglycerides, and cholesterol. Heart attack and stroke are both medical emergencies. There are many things that you can do to help prevent heart disease  and stroke:  Have your blood pressure checked at least every 1-2 years. High blood pressure causes heart disease and increases the risk of stroke.  If you are 53-22 years old, ask your health care provider if you should take aspirin to prevent a heart attack or a stroke.  Do not use any tobacco products, including cigarettes, chewing tobacco, or electronic cigarettes. If you need help quitting, ask your health care provider.  It is important to eat a healthy diet and maintain a healthy weight. ? Be sure to include plenty of vegetables, fruits, low-fat dairy products, and lean protein. ? Avoid eating foods that are high in solid fats, added sugars, or salt (sodium).  Get regular exercise. This is one of the most important things that you can do for your health. ? Try to exercise for at least 150 minutes each week. The type of exercise that you do should increase your heart rate and make you sweat. This is known as moderate-intensity exercise. ? Try to do strengthening exercises at least twice each week. Do these in addition to the moderate-intensity exercise.  Know your numbers.Ask your health care provider to check your cholesterol and your blood glucose. Continue to have your blood tested as directed by your health care provider.  What should I know about cancer screening? There are several types of cancer. Take the following steps to reduce your risk and to catch any cancer development as early as possible. Breast Cancer  Practice breast self-awareness. ? This means understanding how your breasts normally appear and feel. ? It also means doing regular breast self-exams. Let your health care provider know about any changes, no matter how small.  If you are 40  or older, have a clinician do a breast exam (clinical breast exam or CBE) every year. Depending on your age, family history, and medical history, it may be recommended that you also have a yearly breast X-ray (mammogram).  If you  have a family history of breast cancer, talk with your health care provider about genetic screening.  If you are at high risk for breast cancer, talk with your health care provider about having an MRI and a mammogram every year.  Breast cancer (BRCA) gene test is recommended for women who have family members with BRCA-related cancers. Results of the assessment will determine the need for genetic counseling and BRCA1 and for BRCA2 testing. BRCA-related cancers include these types: ? Breast. This occurs in males or females. ? Ovarian. ? Tubal. This may also be called fallopian tube cancer. ? Cancer of the abdominal or pelvic lining (peritoneal cancer). ? Prostate. ? Pancreatic.  Cervical, Uterine, and Ovarian Cancer Your health care provider may recommend that you be screened regularly for cancer of the pelvic organs. These include your ovaries, uterus, and vagina. This screening involves a pelvic exam, which includes checking for microscopic changes to the surface of your cervix (Pap test).  For women ages 21-65, health care providers may recommend a pelvic exam and a Pap test every three years. For women ages 79-65, they may recommend the Pap test and pelvic exam, combined with testing for human papilloma virus (HPV), every five years. Some types of HPV increase your risk of cervical cancer. Testing for HPV may also be done on women of any age who have unclear Pap test results.  Other health care providers may not recommend any screening for nonpregnant women who are considered low risk for pelvic cancer and have no symptoms. Ask your health care provider if a screening pelvic exam is right for you.  If you have had past treatment for cervical cancer or a condition that could lead to cancer, you need Pap tests and screening for cancer for at least 20 years after your treatment. If Pap tests have been discontinued for you, your risk factors (such as having a new sexual partner) need to be  reassessed to determine if you should start having screenings again. Some women have medical problems that increase the chance of getting cervical cancer. In these cases, your health care provider may recommend that you have screening and Pap tests more often.  If you have a family history of uterine cancer or ovarian cancer, talk with your health care provider about genetic screening.  If you have vaginal bleeding after reaching menopause, tell your health care provider.  There are currently no reliable tests available to screen for ovarian cancer.  Lung Cancer Lung cancer screening is recommended for adults 69-62 years old who are at high risk for lung cancer because of a history of smoking. A yearly low-dose CT scan of the lungs is recommended if you:  Currently smoke.  Have a history of at least 30 pack-years of smoking and you currently smoke or have quit within the past 15 years. A pack-year is smoking an average of one pack of cigarettes per day for one year.  Yearly screening should:  Continue until it has been 15 years since you quit.  Stop if you develop a health problem that would prevent you from having lung cancer treatment.  Colorectal Cancer  This type of cancer can be detected and can often be prevented.  Routine colorectal cancer screening usually begins at  age 42 and continues through age 45.  If you have risk factors for colon cancer, your health care provider may recommend that you be screened at an earlier age.  If you have a family history of colorectal cancer, talk with your health care provider about genetic screening.  Your health care provider may also recommend using home test kits to check for hidden blood in your stool.  A small camera at the end of a tube can be used to examine your colon directly (sigmoidoscopy or colonoscopy). This is done to check for the earliest forms of colorectal cancer.  Direct examination of the colon should be repeated every  5-10 years until age 71. However, if early forms of precancerous polyps or small growths are found or if you have a family history or genetic risk for colorectal cancer, you may need to be screened more often.  Skin Cancer  Check your skin from head to toe regularly.  Monitor any moles. Be sure to tell your health care provider: ? About any new moles or changes in moles, especially if there is a change in a mole's shape or color. ? If you have a mole that is larger than the size of a pencil eraser.  If any of your family members has a history of skin cancer, especially at a young age, talk with your health care provider about genetic screening.  Always use sunscreen. Apply sunscreen liberally and repeatedly throughout the day.  Whenever you are outside, protect yourself by wearing long sleeves, pants, a wide-brimmed hat, and sunglasses.  What should I know about osteoporosis? Osteoporosis is a condition in which bone destruction happens more quickly than new bone creation. After menopause, you may be at an increased risk for osteoporosis. To help prevent osteoporosis or the bone fractures that can happen because of osteoporosis, the following is recommended:  If you are 46-71 years old, get at least 1,000 mg of calcium and at least 600 mg of vitamin D per day.  If you are older than age 55 but younger than age 65, get at least 1,200 mg of calcium and at least 600 mg of vitamin D per day.  If you are older than age 54, get at least 1,200 mg of calcium and at least 800 mg of vitamin D per day.  Smoking and excessive alcohol intake increase the risk of osteoporosis. Eat foods that are rich in calcium and vitamin D, and do weight-bearing exercises several times each week as directed by your health care provider. What should I know about how menopause affects my mental health? Depression may occur at any age, but it is more common as you become older. Common symptoms of depression  include:  Low or sad mood.  Changes in sleep patterns.  Changes in appetite or eating patterns.  Feeling an overall lack of motivation or enjoyment of activities that you previously enjoyed.  Frequent crying spells.  Talk with your health care provider if you think that you are experiencing depression. What should I know about immunizations? It is important that you get and maintain your immunizations. These include:  Tetanus, diphtheria, and pertussis (Tdap) booster vaccine.  Influenza every year before the flu season begins.  Pneumonia vaccine.  Shingles vaccine.  Your health care provider may also recommend other immunizations. This information is not intended to replace advice given to you by your health care provider. Make sure you discuss any questions you have with your health care provider. Document Released: 06/24/2005  Document Revised: 11/20/2015 Document Reviewed: 02/03/2015 Elsevier Interactive Patient Education  2018 [employer] Inc[employer]  

## 2017-10-31 NOTE — Progress Notes (Signed)
Patient ID: Karen Washington, female    DOB: 04/02/1956  Age: 62 y.o. MRN: [id number]  The patient is here for annual wellness examination and management of other chronic and acute problems.   The risk factors are reflected in the social history.  The roster of all physicians providing medical care to patient - is listed in the Snapshot section of the chart.  Activities of daily living:  The patient is 100% independent in all ADLs: dressing, toileting, feeding as well as independent mobility  Home safety : The patient has smoke detectors in the home. They wear seatbelts.  There are no firearms at home. There is no violence in the home.   There is no risks for hepatitis, STDs or HIV. There is no   history of blood transfusion. They have no travel history to infectious disease endemic areas of the world.  The patient has seen their dentist in the last six month. They have seen their eye doctor in the last year.  They have deferred audiologic testing in the last year.  They do not  have excessive sun exposure. Discussed the need for sun protection: hats, long sleeves and use of sunscreen if there is significant sun exposure.   Diet: the importance of a healthy diet is discussed. They do have a healthy diet.  The benefits of regular aerobic exercise were discussed. She exercises vigorously 4 times per week ,  60 minutes.   Depression screen: there are no signs or vegative symptoms of depression- irritability, change in appetite, anhedonia, sadness/tearfullness. .  The following portions of the patient's history were reviewed and updated as appropriate: allergies, current medications, past family history, past medical history,  past surgical history, past social history  and problem list.  Visual acuity was not assessed per patient preference since she has regular follow up with her ophthalmologist. Hearing and body mass index were assessed and reviewed.   During the course of the visit the patient  was educated and counseled about appropriate screening and preventive services including : fall prevention , diabetes screening, nutrition counseling, colorectal cancer screening, and recommended immunizations.    CC: The primary encounter diagnosis was Fatigue, unspecified type. Diagnoses of Cervical cancer screening, Encounter for preventive health examination, and Carcinoma of upper-inner quadrant of right breast in female, estrogen receptor positive ([place]) were also pertinent to this visit.  History Shaylon has a past medical history of Breast cancer ([place]) (2014), Breast cancer of upper-inner quadrant of right female breast [facility]) (July, 2014), Personal history of chemotherapy (02/03/2013), Personal history of radiation therapy (2015), and Shingles (2014).   She has a past surgical history that includes Breast surgery (Right, December 13 2012); Portacath placement (2014); Mastopexy (Left, Nov 2015); Port-a-cath removal (02-05-14); Colonoscopy (2011); Breast lumpectomy (Right, 12/13/2012); Breast biopsy (Right, 11/22/2012); and Reduction mammaplasty (Left, 2015).   Her family history includes Breast cancer in her maternal grandmother; Cancer in her father and mother.She reports that she has never smoked. She has never used smokeless tobacco. She reports that she drinks alcohol. She reports that she does not use drugs.  Outpatient Medications Prior to Visit  Medication Sig Dispense Refill  . cholecalciferol (VITAMIN D) 1000 units tablet Take 1,000 Units by mouth daily.    Marland Kitchen ALPRAZolam (XANAX) 0.25 MG tablet Take 1 tablet (0.25 mg total) by mouth at bedtime as needed for sleep. (Patient not taking: Reported on 03/16/2017) 30 tablet 1  . calcium-vitamin D (OSCAL WITH D) 250-125 MG-UNIT tablet Take 1 tablet  by mouth daily.     No facility-administered medications prior to visit.     Review of Systems   Patient denies headache, fevers, malaise, unintentional weight loss, skin rash, eye pain, sinus  congestion and sinus pain, sore throat, dysphagia,  hemoptysis , cough, dyspnea, wheezing, chest pain, palpitations, orthopnea, edema, abdominal pain, nausea, melena, diarrhea, constipation, flank pain, dysuria, hematuria, urinary  Frequency, nocturia, numbness, tingling, seizures,  Focal weakness, Loss of consciousness,  Tremor, insomnia, depression, anxiety, and suicidal ideation.      Objective:  BP 102/74 (BP Location: Left Arm, Patient Position: Sitting, Cuff Size: Normal)   Pulse 73   Temp 98.4 F (36.9 C) (Oral)   Resp 14   Ht 5\' 6"  (1.676 m)   Wt 131 lb 12.8 oz (59.8 kg)   SpO2 97%   BMI 21.27 kg/m   Physical Exam   General Appearance:    Alert, cooperative, no distress, appears stated age  Head:    Normocephalic, without obvious abnormality, atraumatic  Eyes:    PERRL, conjunctiva/corneas clear, EOM's intact, fundi    benign, both eyes  Ears:    Normal TM's and external ear canals, both ears  Nose:   Nares normal, septum midline, mucosa normal, no drainage    or sinus tenderness  Throat:   Lips, mucosa, and tongue normal; teeth and gums normal  Neck:   Supple, symmetrical, trachea midline, no adenopathy;    thyroid:  no enlargement/tenderness/nodules; no carotid   bruit or JVD  Back:     Symmetric, no curvature, ROM normal, no CVA tenderness  Lungs:     Clear to auscultation bilaterally, respirations unlabored  Chest Wall:    No tenderness or deformity   Heart:    Regular rate and rhythm, S1 and S2 normal, no murmur, rub   or gallop  Breast Exam:    No tenderness, masses, or nipple abnormality  Abdomen:     Soft, non-tender, bowel sounds active all four quadrants,    no masses, no organomegaly  Genitalia:    Pelvic: cervix normal in appearance, external genitalia normal, no adnexal masses or tenderness, no cervical motion tenderness, rectovaginal septum normal, uterus normal size, shape, and consistency and vagina normal without discharge  Extremities:   Extremities  normal, atraumatic, no cyanosis or edema  Pulses:   2+ and symmetric all extremities  Skin:   Skin color, texture, turgor normal, no rashes or lesions  Lymph nodes:   Cervical, supraclavicular, and axillary nodes normal  Neurologic:   CNII-XII intact, normal strength, sensation and reflexes    throughout      Assessment & Plan:   Problem List Items Addressed This Visit    Encounter for preventive health examination    Annual comprehensive preventive exam was done as well as an evaluation and management of chronic conditions .  During the course of the visit the patient was educated and counseled about appropriate screening and preventive services including :  diabetes screening, lipid analysis with projected  10 year  risk for CAD , nutrition counseling, breast, cervical and colorectal cancer screening, and recommended immunizations.  Printed recommendations for health maintenance screenings was given      Carcinoma of upper-inner quadrant of right breast in female, estrogen receptor positive ([place])    No recurrence since diagnosis and lumpectmy and treatment.  Continue annual mammogram and oncology follow up       Other Visit Diagnoses    Fatigue, unspecified type    -  Primary   Relevant Orders   Comprehensive metabolic panel   TSH   Cervical cancer screening       Relevant Orders   Cytology - PAP      I have discontinued Jaleigh C. Weinrich's calcium-vitamin D and ALPRAZolam. I am also having her start on Zoster Vaccine Adjuvanted. Additionally, I am having her maintain her cholecalciferol.  Meds ordered this encounter  Medications  . Zoster Vaccine Adjuvanted [facility]) injection    Sig: Inject 0.5 mLs into the muscle once for 1 dose.    Dispense:  1 each    Refill:  1    Medications Discontinued During This Encounter  Medication Reason  . ALPRAZolam (XANAX) 0.25 MG tablet Patient has not taken in last 30 days  . calcium-vitamin D (OSCAL WITH D) 250-125 MG-UNIT tablet  Patient has not taken in last 30 days    Follow-up: No follow-ups on file.   Crecencio Mc, MD

## 2017-11-01 LAB — COMPREHENSIVE METABOLIC PANEL
ALBUMIN: 4.6 g/dL (ref 3.5–5.2)
ALK PHOS: 58 U/L (ref 39–117)
ALT: 16 U/L (ref 0–35)
AST: 19 U/L (ref 0–37)
BILIRUBIN TOTAL: 0.5 mg/dL (ref 0.2–1.2)
BUN: 22 mg/dL (ref 6–23)
CALCIUM: 9.7 mg/dL (ref 8.4–10.5)
CO2: 30 mEq/L (ref 19–32)
Chloride: 101 mEq/L (ref 96–112)
Creatinine, Ser: 0.98 mg/dL (ref 0.40–1.20)
GFR: 61.52 mL/min (ref >60.00)
Glucose, Bld: 94 mg/dL (ref 70–99)
POTASSIUM: 4.3 meq/L (ref 3.5–5.1)
Sodium: 139 mEq/L (ref 135–145)
TOTAL PROTEIN: 7.1 g/dL (ref 6.0–8.3)

## 2017-11-01 LAB — TSH: TSH: 1.22 u[IU]/mL (ref 0.35–4.50)

## 2017-11-02 LAB — CYTOLOGY - PAP
Diagnosis: NEGATIVE
HPV (WINDOPATH): NOT DETECTED

## 2017-11-08 ENCOUNTER — Other Ambulatory Visit: Payer: Self-pay

## 2017-11-08 DIAGNOSIS — Z171 Estrogen receptor negative status [ER-]: Secondary | ICD-10-CM

## 2017-11-08 DIAGNOSIS — C50211 Malignant neoplasm of upper-inner quadrant of right female breast: Secondary | ICD-10-CM

## 2017-11-29 ENCOUNTER — Encounter: Payer: Self-pay | Admitting: Internal Medicine

## 2017-11-30 MED ORDER — ALPRAZOLAM 0.25 MG PO TABS: 0.2500 mg | ORAL_TABLET | Freq: Every evening | ORAL | 1 refills | Status: DC | PRN

## 2017-11-30 NOTE — Addendum Note (Signed)
Addended by: Crecencio Mc on: 11/30/2017 01:22 PM   Modules accepted: Orders

## 2018-01-09 ENCOUNTER — Ambulatory Visit
Admission: RE | Admit: 2018-01-09 | Discharge: 2018-01-09 | Disposition: A | Discharge status: Home / Self Care | Payer: BC Managed Care – PPO | Source: Ambulatory Visit | Attending: General Surgery | Admitting: General Surgery

## 2018-01-09 DIAGNOSIS — C50211 Malignant neoplasm of upper-inner quadrant of right female breast: Secondary | ICD-10-CM

## 2018-01-09 DIAGNOSIS — Z171 Estrogen receptor negative status [ER-]: Secondary | ICD-10-CM | POA: Insufficient documentation

## 2018-01-18 ENCOUNTER — Ambulatory Visit: Payer: BC Managed Care – PPO | Admitting: General Surgery

## 2018-02-01 ENCOUNTER — Ambulatory Visit: Payer: BC Managed Care – PPO | Admitting: General Surgery

## 2018-02-01 ENCOUNTER — Other Ambulatory Visit: Payer: Self-pay

## 2018-02-01 ENCOUNTER — Encounter: Payer: Self-pay | Admitting: General Surgery

## 2018-02-01 VITALS — BP 122/72 | HR 76 | Temp 92.7°F | Resp 12 | Ht 66.0 in | Wt 132.8 lb

## 2018-02-01 DIAGNOSIS — Z171 Estrogen receptor negative status [ER-]: Secondary | ICD-10-CM

## 2018-02-01 DIAGNOSIS — C50211 Malignant neoplasm of upper-inner quadrant of right female breast: Secondary | ICD-10-CM | POA: Diagnosis not present

## 2018-02-01 NOTE — Patient Instructions (Addendum)
Patient is to return in 1 year with bilateral diagnostic mammogram.

## 2018-02-01 NOTE — Progress Notes (Signed)
Patient ID: Karen Washington, female   DOB: [birth year], 62 y.o.   MRN: [id number]  Chief Complaint  Patient presents with  . Follow-up    1 yr recall bilat dx mammo sch'd 01-09-18 LM FOR PT TO CALL TO R/S APPT    HPI Karen Washington is a 62 y.o. female here today to follow up for 1 year bilateral mammogram. Patient states she is feeling well.    HPI  Past Medical History:  Diagnosis Date  . Breast cancer of upper-inner quadrant of right female breast ([place]) 11/2012   Right breast 1.5 cm, node negative, ER -, PR -, Her 2 neu over-expressed. Whole breast radiation.   Adjuvant chemotherapy with Docetaxel/Carboplatin/Herceptin starting on 01/15/13, completed 6 cycles of Docetaxel/Carboplatin on 04/29/13.  Completed 52 weeks of Herceptin on 01/06/14.  . Shingles 2014    Past Surgical History:  Procedure Laterality Date  . BREAST BIOPSY Right 11/22/2012   INVASIVE MAMMARY CARCINOMA, NO SPECIAL TYPE, DCIS  . BREAST LUMPECTOMY Right 12/13/2012   invasive mammary carcinoma and DCIS. Clear margins  . BREAST SURGERY Right December 13 2012   lumpectomy  . COLONOSCOPY  2011  . MASTOPEXY Left Nov 2015   Dr Tula Nakayama  . PORT-A-CATH REMOVAL  02-05-14   Dr Bary Castilla  . PORTACATH PLACEMENT  2014    Family History  Problem Relation Age of Onset  . Cancer Mother        uterine   . Breast cancer Maternal Grandmother   . Cancer Father        prostate    Social History Social History   Tobacco Use  . Smoking status: Never Smoker  . Smokeless tobacco: Never Used  Substance Use Topics  . Alcohol use: Yes  . Drug use: No    No Known Allergies  Current Outpatient Medications  Medication Sig Dispense Refill  . ALPRAZolam (XANAX) 0.25 MG tablet Take 1 tablet (0.25 mg total) by mouth at bedtime as needed for sleep. 30 tablet 1  . cholecalciferol (VITAMIN D) 1000 units tablet Take 1,000 Units by mouth daily.     No current facility-administered medications for this visit.     Review of Systems Review of  Systems  Constitutional: Negative.   Respiratory: Negative.   Cardiovascular: Negative.     Blood pressure 122/72, pulse 76, temperature (!) 92.7 F (33.7 C), temperature source Temporal, resp. rate 12, height 5\' 6"  (1.676 m), weight 132 lb 12.8 oz (60.2 kg), SpO2 98 %.  Physical Exam Physical Exam  Constitutional: She is oriented to person, place, and time. She appears well-developed and well-nourished.  Eyes: Conjunctivae are normal. No scleral icterus.  Neck: Normal range of motion.  Cardiovascular: Normal rate, regular rhythm and normal heart sounds.  Pulmonary/Chest: Effort normal and breath sounds normal. Right breast exhibits no inverted nipple, no mass, no nipple discharge, no skin change and no tenderness. Left breast exhibits no inverted nipple, no mass, no nipple discharge, no skin change and no tenderness. Breasts are symmetrical.  Excellent breast volume symmetry.    Lymphadenopathy:    She has no cervical adenopathy.    She has no axillary adenopathy.       Right: No supraclavicular adenopathy present.       Left: No supraclavicular adenopathy present.  Neurological: She is alert and oriented to person, place, and time.  Skin: Skin is warm and dry.    Data Reviewed Bilateral diagnostic mammograms dated January 09, 2018 reviewed.  Postsurgical changes.  BI-RADS-2.  Assessment    No evidence of recurrent cancer.    Plan Patient is to return in 1 year with bilateral diagnostic mammogram. HPI, Physical Exam, Assessment and Plan have been scribed under the direction and in the presence of Hervey Ard, Md.  Eudelia Bunch R. Bobette Mo, CMA  I have completed the exam and reviewed the above documentation for accuracy and completeness.  I agree with the above.  Haematologist has been used and any errors in dictation or transcription are unintentional.  Hervey Ard, M.D., F.A.C.S.  [name] 02/01/2018, 8:59 PM

## 2018-03-05 ENCOUNTER — Ambulatory Visit (INDEPENDENT_AMBULATORY_CARE_PROVIDER_SITE_OTHER): Payer: BC Managed Care – PPO

## 2018-03-05 ENCOUNTER — Ambulatory Visit (INDEPENDENT_AMBULATORY_CARE_PROVIDER_SITE_OTHER): Payer: BC Managed Care – PPO | Admitting: Vascular Surgery

## 2018-03-05 ENCOUNTER — Encounter (INDEPENDENT_AMBULATORY_CARE_PROVIDER_SITE_OTHER): Payer: Self-pay | Admitting: Vascular Surgery

## 2018-03-05 VITALS — BP 115/74 | HR 73 | Resp 16 | Ht 65.0 in | Wt 132.0 lb

## 2018-03-05 DIAGNOSIS — Z7982 Long term (current) use of aspirin: Secondary | ICD-10-CM

## 2018-03-05 DIAGNOSIS — I721 Aneurysm of artery of upper extremity: Secondary | ICD-10-CM | POA: Diagnosis not present

## 2018-03-11 ENCOUNTER — Encounter (INDEPENDENT_AMBULATORY_CARE_PROVIDER_SITE_OTHER): Payer: Self-pay | Admitting: Vascular Surgery

## 2018-03-11 NOTE — Progress Notes (Signed)
MRN : [id number]  Karen Washington is a 62 y.o. (03-15-1956) female who presents with chief complaint of  Chief Complaint  Patient presents with  . Follow-up    1 year LUE Arterial duplex  .  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 The aneurysm is followed by ultrasound.   The patient denies pain in the left arm or changes to the left fingers.  No ulcers of the hand have occurred. The patient denies amaurosis fugax. There is no recent history of TIA symptoms or focal motor deficits. There is no prior documented CVA.  The patient is taking enteric-coated aspirin 81 mg daily.  There is no history of migraine headaches. There is no history of seizures.  The patient has a history of coronary artery disease, no recent episodes of angina or shortness of breath. The patient denies PAD or claudication symptoms. There is a history of hyperlipidemia which is being treated with a statin.   Left arm duplex done today shows the aneurysm is 0.32 cm.  No change compared to last study  No outpatient medications have been marked as taking for the 03/05/18 encounter (Office Visit) with Delana Meyer, Dolores Lory, MD.    Past Medical History:  Diagnosis Date  . Breast cancer of upper-inner quadrant of right female breast ([place]) 11/2012   Right breast 1.5 cm, node negative, ER -, PR -, Her 2 neu over-expressed. Whole breast radiation.   Adjuvant chemotherapy with Docetaxel/Carboplatin/Herceptin starting on 01/15/13, completed 6 cycles of Docetaxel/Carboplatin on 04/29/13.  Completed 52 weeks of Herceptin on 01/06/14.  . Shingles 2014    Past Surgical History:  Procedure Laterality Date  . BREAST BIOPSY Right 11/22/2012   INVASIVE MAMMARY CARCINOMA, NO SPECIAL TYPE, DCIS  . BREAST LUMPECTOMY Right 12/13/2012   invasive mammary  carcinoma and DCIS. Clear margins  . BREAST SURGERY Right December 13 2012   lumpectomy  . COLONOSCOPY  2011  . MASTOPEXY Left Nov 2015   Dr Tula Nakayama  . PORT-A-CATH REMOVAL  02-05-14   Dr Bary Castilla  . PORTACATH PLACEMENT  2014    Social History Social History   Tobacco Use  . Smoking status: Never Smoker  . Smokeless tobacco: Never Used  Substance Use Topics  . Alcohol use: Yes  . Drug use: No    Family History Family History  Problem Relation Age of Onset  . Cancer Mother        uterine   . Breast cancer Maternal Grandmother   . Cancer Father        prostate    No Known Allergies   REVIEW OF SYSTEMS (Negative unless checked)  Constitutional: [] Weight loss  [] Fever  [] Chills Cardiac: [] Chest pain   [] Chest pressure   [] Palpitations   [] Shortness of breath when laying flat   [] Shortness of breath with exertion. Vascular:  [] Pain in legs with walking   [] Pain in legs at rest  [] History of DVT   [] Phlebitis   [] Swelling in legs   [] Varicose veins   [] Non-healing ulcers Pulmonary:   [] Uses home oxygen   [] Productive cough   [] Hemoptysis   [] Wheeze  [] COPD   [] Asthma Neurologic:  [] Dizziness   [] Seizures   [] History of stroke   [] History of TIA  [] Aphasia   [] Vissual changes   [] Weakness or  numbness in arm   [] Weakness or numbness in leg Musculoskeletal:   [] Joint swelling   [] Joint pain   [] Low back pain Hematologic:  [] Easy bruising  [] Easy bleeding   [] Hypercoagulable state   [] Anemic Gastrointestinal:  [] Diarrhea   [] Vomiting  [] Gastroesophageal reflux/heartburn   [] Difficulty swallowing. Genitourinary:  [] Chronic kidney disease   [] Difficult urination  [] Frequent urination   [] Blood in urine Skin:  [] Rashes   [] Ulcers  Psychological:  [] History of anxiety   []  History of major depression.  Physical Examination  Vitals:   03/05/18 1031  BP: 115/74  Pulse: 73  Resp: 16  Weight: 132 lb (59.9 kg)  Height: 5\' 5"  (1.651 m)   Body mass index is 21.97 kg/m. Gen: WD/WN,  NAD Head: [place]/AT, No temporalis wasting.  Ear/Nose/Throat: Hearing grossly intact, nares w/o erythema or drainage Eyes: PER, EOMI, sclera nonicteric.  Neck: Supple, no large masses.   Pulmonary:  Good air movement, no audible wheezing bilaterally, no use of accessory muscles.  Cardiac: RRR, no JVD Vascular:  Pulsatile mass left supraclavicular Vessel Right Left  Radial Palpable Palpable  Ulnar Palpable Palpable  Brachial Palpable Palpable  Carotid Palpable Palpable  Gastrointestinal: Non-distended. No guarding/no peritoneal signs.  Musculoskeletal: M/S 5/5 throughout.  No deformity or atrophy.  Neurologic: CN 2-12 intact. Symmetrical.  Speech is fluent. Motor exam as listed above. Psychiatric: Judgment intact, Mood & affect appropriate for pt's clinical situation. Dermatologic: No rashes or ulcers noted.  No changes consistent with cellulitis. Lymph : No lichenification or skin changes of chronic lymphedema.  CBC Lab Results  Component Value Date   WBC 6.7 03/16/2017   HGB 13.9 03/16/2017   HCT 41.1 03/16/2017   MCV 94.4 03/16/2017   PLT 252 03/16/2017    BMET    Component Value Date/Time   NA 139 10/31/2017 1533   NA 142 01/06/2014 1338   K 4.3 10/31/2017 1533   K 3.8 01/06/2014 1338   CL 101 10/31/2017 1533   CL 106 01/06/2014 1338   CO2 30 10/31/2017 1533   CO2 31 01/06/2014 1338   GLUCOSE 94 10/31/2017 1533   GLUCOSE 80 01/06/2014 1338   BUN 22 10/31/2017 1533   BUN 16 01/06/2014 1338   CREATININE 0.98 10/31/2017 1533   CREATININE 0.87 08/29/2014 1053   CALCIUM 9.7 10/31/2017 1533   CALCIUM 9.2 01/06/2014 1338   GFRNONAA >60 03/16/2017 1035   GFRNONAA >60 08/29/2014 1053   GFRAA >60 03/16/2017 1035   GFRAA >60 08/29/2014 1053   CrCl cannot be calculated (Patient's most recent lab result is older than the maximum 21 days allowed.).  COAG No results found for: INR, PROTIME  Radiology No results found.   Assessment/Plan 1. 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  Duplex ultrasound shows the aneurysm is small and does not appear to have grown.  Continue antiplatelet therapy as prescribed Continue management of all comorbidities Healthy heart diet,  encouraged exercise at least 4 times per week  Follow up in 12 months with duplex ultrasound and physical exam   - VAS [place]; Future    Hortencia Pilar, MD  03/11/2018 12:22 PM

## 2018-03-16 ENCOUNTER — Inpatient Hospital Stay: Payer: BC Managed Care – PPO | Attending: Internal Medicine

## 2018-03-16 ENCOUNTER — Inpatient Hospital Stay (HOSPITAL_BASED_OUTPATIENT_CLINIC_OR_DEPARTMENT_OTHER): Payer: BC Managed Care – PPO | Admitting: Internal Medicine

## 2018-03-16 ENCOUNTER — Encounter: Payer: Self-pay | Admitting: Internal Medicine

## 2018-03-16 VITALS — BP 120/74 | HR 74 | Temp 98.1°F | Resp 16 | Wt 134.0 lb

## 2018-03-16 DIAGNOSIS — C50211 Malignant neoplasm of upper-inner quadrant of right female breast: Secondary | ICD-10-CM

## 2018-03-16 DIAGNOSIS — Z923 Personal history of irradiation: Secondary | ICD-10-CM | POA: Insufficient documentation

## 2018-03-16 DIAGNOSIS — Z9221 Personal history of antineoplastic chemotherapy: Secondary | ICD-10-CM | POA: Insufficient documentation

## 2018-03-16 DIAGNOSIS — Z13820 Encounter for screening for osteoporosis: Secondary | ICD-10-CM | POA: Insufficient documentation

## 2018-03-16 DIAGNOSIS — Z170 Estrogen receptor positive status [ER+]: Secondary | ICD-10-CM | POA: Diagnosis not present

## 2018-03-16 DIAGNOSIS — Z79899 Other long term (current) drug therapy: Secondary | ICD-10-CM | POA: Diagnosis not present

## 2018-03-16 DIAGNOSIS — M810 Age-related osteoporosis without current pathological fracture: Secondary | ICD-10-CM

## 2018-03-16 LAB — CBC WITH DIFFERENTIAL/PLATELET
ABS IMMATURE GRANULOCYTES: 0.01 10*3/uL (ref 0.00–0.07)
BASOS PCT: 1 %
Basophils Absolute: 0 10*3/uL (ref 0.0–0.1)
EOS PCT: 1 %
Eosinophils Absolute: 0.1 10*3/uL (ref 0.0–0.5)
HCT: 41.2 % (ref 36.0–46.0)
HEMOGLOBIN: 13.4 g/dL (ref 12.0–15.0)
Immature Granulocytes: 0 %
Lymphocytes Relative: 17 %
Lymphs Abs: 1.2 10*3/uL (ref 0.7–4.0)
MCH: 31.1 pg (ref 26.0–34.0)
MCHC: 32.5 g/dL (ref 30.0–36.0)
MCV: 95.6 fL (ref 80.0–100.0)
MONO ABS: 0.7 10*3/uL (ref 0.1–1.0)
MONOS PCT: 9 %
NEUTROS ABS: 5 10*3/uL (ref 1.7–7.7)
Neutrophils Relative %: 72 %
PLATELETS: 227 10*3/uL (ref 150–400)
RBC: 4.31 MIL/uL (ref 3.87–5.11)
RDW: 11.8 % (ref 11.5–15.5)
WBC: 6.9 10*3/uL (ref 4.0–10.5)
nRBC: 0 % (ref 0.0–0.2)

## 2018-03-16 NOTE — Progress Notes (Signed)
[facility] OFFICE PROGRESS NOTE  Patient Care Team: Crecencio Mc, MD as PCP - General (Internal Medicine) Bary Castilla, Forest Gleason, MD (General Surgery) Crecencio Mc, MD (Internal Medicine)   SUMMARY OF ONCOLOGIC HISTORY:  Oncology History   # 2014-RIGHT BREAST CA STAGE I ER/PR NEG; Her-2 NEU POSITIVE;s/p LUMPEC & SLNBx; s/p TCH x 6; s/p RT  # BMD Nov 2019- ordered.    DIAGNOSIS: Right breast cancer  STAGE:  I      ;GOALS: Cure  CURRENT/MOST RECENT THERAPY: Surveillance     Carcinoma of upper-inner quadrant of right breast in female, estrogen receptor positive ([place])      INTERVAL HISTORY:  A very pleasant 62 year old female patient with above history of stage I ER/PR negative HER-2/neu positive breast cancer status post chemotherapy post radiation [2014] is here for follow-up.  Patient had a recent mammogram with Dr. Bary Castilla; within normal limits.   Appetite is good.  No weight loss.  No nausea no vomiting.  No chest pain or shortness of breath or cough.  No headaches.  Review of Systems  Constitutional: Negative for chills, diaphoresis, fever, malaise/fatigue and weight loss.  HENT: Negative for nosebleeds and sore throat.   Eyes: Negative for double vision.  Respiratory: Negative for cough, hemoptysis, sputum production, shortness of breath and wheezing.   Cardiovascular: Negative for chest pain, palpitations, orthopnea and leg swelling.  Gastrointestinal: Negative for abdominal pain, blood in stool, constipation, diarrhea, heartburn, melena, nausea and vomiting.  Genitourinary: Negative for dysuria, frequency and urgency.  Musculoskeletal: Negative for back pain and joint pain.  Skin: Negative.  Negative for itching and rash.  Neurological: Negative for dizziness, tingling, focal weakness, weakness and headaches.  Endo/Heme/Allergies: Does not bruise/bleed easily.  Psychiatric/Behavioral: Negative for depression. The patient is not nervous/anxious and  does not have insomnia.      PAST MEDICAL HISTORY :  Past Medical History:  Diagnosis Date  . Breast cancer of upper-inner quadrant of right female breast ([place]) 11/2012   Right breast 1.5 cm, node negative, ER -, PR -, Her 2 neu over-expressed. Whole breast radiation.   Adjuvant chemotherapy with Docetaxel/Carboplatin/Herceptin starting on 01/15/13, completed 6 cycles of Docetaxel/Carboplatin on 04/29/13.  Completed 52 weeks of Herceptin on 01/06/14.  . Shingles 2014    PAST SURGICAL HISTORY :   Past Surgical History:  Procedure Laterality Date  . BREAST BIOPSY Right 11/22/2012   INVASIVE MAMMARY CARCINOMA, NO SPECIAL TYPE, DCIS  . BREAST LUMPECTOMY Right 12/13/2012   invasive mammary carcinoma and DCIS. Clear margins  . BREAST SURGERY Right December 13 2012   lumpectomy  . COLONOSCOPY  2011  . MASTOPEXY Left Nov 2015   Dr Tula Nakayama  . PORT-A-CATH REMOVAL  02-05-14   Dr Bary Castilla  . PORTACATH PLACEMENT  2014    FAMILY HISTORY :   Family History  Problem Relation Age of Onset  . Cancer Mother        uterine   . Breast cancer Maternal Grandmother   . Cancer Father        prostate    SOCIAL HISTORY:   Social History   Tobacco Use  . Smoking status: Never Smoker  . Smokeless tobacco: Never Used  Substance Use Topics  . Alcohol use: Yes  . Drug use: No    ALLERGIES:  has No Known Allergies.  MEDICATIONS:  Current Outpatient Medications  Medication Sig Dispense Refill  . ALPRAZolam (XANAX) 0.25 MG tablet Take 1 tablet (0.25 mg total) by  mouth at bedtime as needed for sleep. 30 tablet 1  . cholecalciferol (VITAMIN D) 1000 units tablet Take 1,000 Units by mouth daily.     No current facility-administered medications for this visit.     PHYSICAL EXAMINATION: ECOG PERFORMANCE STATUS: 0 - Asymptomatic  BP 120/74 (BP Location: Left Arm, Patient Position: Sitting)   Pulse 74   Temp 98.1 F (36.7 C) (Tympanic)   Resp 16   Wt 134 lb (60.8 kg)   BMI 22.30 kg/m   Filed  Weights   03/16/18 1111  Weight: 134 lb (60.8 kg)    Physical Exam  Constitutional: She is oriented to person, place, and time and well-developed, well-nourished, and in no distress.  HENT:  Head: Normocephalic and atraumatic.  Mouth/Throat: Oropharynx is clear and moist. No oropharyngeal exudate.  Eyes: Pupils are equal, round, and reactive to light.  Neck: Normal range of motion. Neck supple.  Cardiovascular: Normal rate and regular rhythm.  Pulmonary/Chest: No respiratory distress. She has no wheezes.  Abdominal: Soft. Bowel sounds are normal. She exhibits no distension and no mass. There is no tenderness. There is no rebound and no guarding.  Musculoskeletal: Normal range of motion. She exhibits no edema or tenderness.  Neurological: She is alert and oriented to person, place, and time.  Skin: Skin is warm.  Psychiatric: Affect normal.      LABORATORY DATA:  I have reviewed the data as listed    Component Value Date/Time   NA 139 10/31/2017 1533   NA 142 01/06/2014 1338   K 4.3 10/31/2017 1533   K 3.8 01/06/2014 1338   CL 101 10/31/2017 1533   CL 106 01/06/2014 1338   CO2 30 10/31/2017 1533   CO2 31 01/06/2014 1338   GLUCOSE 94 10/31/2017 1533   GLUCOSE 80 01/06/2014 1338   BUN 22 10/31/2017 1533   BUN 16 01/06/2014 1338   CREATININE 0.98 10/31/2017 1533   CREATININE 0.87 08/29/2014 1053   CALCIUM 9.7 10/31/2017 1533   CALCIUM 9.2 01/06/2014 1338   PROT 7.1 10/31/2017 1533   PROT 7.3 08/29/2014 1053   ALBUMIN 4.6 10/31/2017 1533   ALBUMIN 4.5 08/29/2014 1053   AST 19 10/31/2017 1533   AST 24 08/29/2014 1053   ALT 16 10/31/2017 1533   ALT 20 08/29/2014 1053   ALKPHOS 58 10/31/2017 1533   ALKPHOS 69 08/29/2014 1053   BILITOT 0.5 10/31/2017 1533   BILITOT 0.5 08/29/2014 1053   GFRNONAA >60 03/16/2017 1035   GFRNONAA >60 08/29/2014 1053   GFRAA >60 03/16/2017 1035   GFRAA >60 08/29/2014 1053    No results found for: SPEP, UPEP  Lab Results  Component  Value Date   WBC 6.9 03/16/2018   NEUTROABS 5.0 03/16/2018   HGB 13.4 03/16/2018   HCT 41.2 03/16/2018   MCV 95.6 03/16/2018   PLT 227 03/16/2018      Chemistry      Component Value Date/Time   NA 139 10/31/2017 1533   NA 142 01/06/2014 1338   K 4.3 10/31/2017 1533   K 3.8 01/06/2014 1338   CL 101 10/31/2017 1533   CL 106 01/06/2014 1338   CO2 30 10/31/2017 1533   CO2 31 01/06/2014 1338   BUN 22 10/31/2017 1533   BUN 16 01/06/2014 1338   CREATININE 0.98 10/31/2017 1533   CREATININE 0.87 08/29/2014 1053      Component Value Date/Time   CALCIUM 9.7 10/31/2017 1533   CALCIUM 9.2 01/06/2014 1338   ALKPHOS  58 10/31/2017 1533   ALKPHOS 69 08/29/2014 1053   AST 19 10/31/2017 1533   AST 24 08/29/2014 1053   ALT 16 10/31/2017 1533   ALT 20 08/29/2014 1053   BILITOT 0.5 10/31/2017 1533   BILITOT 0.5 08/29/2014 1053        ASSESSMENT & PLAN:   Carcinoma of upper-inner quadrant of right breast in female, estrogen receptor positive ([place]) # Right breast cancer stage I ER/PR negative HER-2/neu positive.  Stable.  Clinically no evidence of recurrence.  Continue surveillance/follow-up with Dr. Bary Castilla.  Mammogram June normal.  # Screening for osteoporosis- recommend BMD.  Discussed-in general treatment be offered if severe osteoporosis is noted.  Otherwise recommend continued vitamin D; physically active/exercise [which patient is currently doing.]  # Follow-up in 12 months with labs-cbc/cmp/ca-27-29 [discussed follow-up; interested].     Cammie Sickle, MD 03/16/2018 11:53 AM

## 2018-03-16 NOTE — Assessment & Plan Note (Addendum)
#  Right breast cancer stage I ER/PR negative HER-2/neu positive.  Stable.  Clinically no evidence of recurrence.  Continue surveillance/follow-up with Dr. Bary Castilla.  Mammogram June normal.  # Screening for osteoporosis- recommend BMD.  Discussed-in general treatment be offered if severe osteoporosis is noted.  Otherwise recommend continued vitamin D; physically active/exercise [which patient is currently doing.]  # Follow-up in 12 months with labs-cbc/cmp/ca-27-29 [discussed follow-up; interested].

## 2018-03-17 LAB — CANCER ANTIGEN 27.29: CAN 27.29: 9.7 U/mL (ref 0.0–38.6)

## 2018-11-06 ENCOUNTER — Other Ambulatory Visit: Payer: Self-pay

## 2018-11-06 ENCOUNTER — Encounter: Payer: Self-pay | Admitting: Internal Medicine

## 2018-11-06 ENCOUNTER — Ambulatory Visit (INDEPENDENT_AMBULATORY_CARE_PROVIDER_SITE_OTHER): Payer: BC Managed Care – PPO

## 2018-11-06 ENCOUNTER — Ambulatory Visit: Payer: BC Managed Care – PPO | Admitting: Internal Medicine

## 2018-11-06 VITALS — BP 106/78 | HR 68 | Temp 98.8°F | Resp 15 | Ht 65.0 in | Wt 138.0 lb

## 2018-11-06 DIAGNOSIS — M5386 Other specified dorsopathies, lumbar region: Secondary | ICD-10-CM

## 2018-11-06 DIAGNOSIS — Z0000 Encounter for general adult medical examination without abnormal findings: Secondary | ICD-10-CM

## 2018-11-06 DIAGNOSIS — R5383 Other fatigue: Secondary | ICD-10-CM

## 2018-11-06 LAB — COMPREHENSIVE METABOLIC PANEL
ALT: 19 U/L (ref 0–35)
AST: 21 U/L (ref 0–37)
Albumin: 4.5 g/dL (ref 3.5–5.2)
Alkaline Phosphatase: 51 U/L (ref 39–117)
BUN: 23 mg/dL (ref 6–23)
CO2: 31 mEq/L (ref 19–32)
Calcium: 9.5 mg/dL (ref 8.4–10.5)
Chloride: 100 mEq/L (ref 96–112)
Creatinine, Ser: 0.88 mg/dL (ref 0.40–1.20)
GFR: 65.31 mL/min (ref >60.00)
Glucose, Bld: 88 mg/dL (ref 70–99)
Potassium: 4.4 mEq/L (ref 3.5–5.1)
Sodium: 137 mEq/L (ref 135–145)
Total Bilirubin: 0.6 mg/dL (ref 0.2–1.2)
Total Protein: 6.7 g/dL (ref 6.0–8.3)

## 2018-11-06 LAB — CBC WITH DIFFERENTIAL/PLATELET
Basophils Absolute: 0 10*3/uL (ref 0.0–0.1)
Basophils Relative: 0.7 % (ref 0.0–3.0)
Eosinophils Absolute: 0.1 10*3/uL (ref 0.0–0.7)
Eosinophils Relative: 1.8 % (ref 0.0–5.0)
HCT: 43.5 % (ref 36.0–46.0)
Hemoglobin: 14.4 g/dL (ref 12.0–15.0)
Lymphocytes Relative: 22.4 % (ref 12.0–46.0)
Lymphs Abs: 1.5 10*3/uL (ref 0.7–4.0)
MCHC: 33.1 g/dL (ref 30.0–36.0)
MCV: 94.9 fl (ref 78.0–100.0)
Monocytes Absolute: 0.6 10*3/uL (ref 0.1–1.0)
Monocytes Relative: 9.2 % (ref 3.0–12.0)
Neutro Abs: 4.4 10*3/uL (ref 1.4–7.7)
Neutrophils Relative %: 65.9 % (ref 43.0–77.0)
Platelets: 268 10*3/uL (ref 150.0–400.0)
RBC: 4.58 Mil/uL (ref 3.87–5.11)
RDW: 13.1 % (ref 11.5–15.5)
WBC: 6.7 10*3/uL (ref 4.0–10.5)

## 2018-11-06 LAB — LIPID PANEL
Cholesterol: 203 mg/dL — ABNORMAL HIGH (ref 0–200)
HDL: 89.2 mg/dL (ref >39.00)
LDL Cholesterol: 100 mg/dL — ABNORMAL HIGH (ref 0–99)
NonHDL: 113.63
Total CHOL/HDL Ratio: 2
Triglycerides: 66 mg/dL (ref 0.0–149.0)
VLDL: 13.2 mg/dL (ref 0.0–40.0)

## 2018-11-06 LAB — TSH: TSH: 1.49 u[IU]/mL (ref 0.35–4.50)

## 2018-11-06 NOTE — Assessment & Plan Note (Signed)
Precipitated by increased physical exertion associated with caregiving.  Improving.. baslinee imaging ordered

## 2018-11-06 NOTE — Patient Instructions (Signed)
Health Maintenance for Postmenopausal Women Menopause is a normal process in which your reproductive ability comes to an end. This process happens gradually over a span of months to years, usually between the ages of 14 and 85. Menopause is complete when you have missed 12 consecutive menstrual periods. It is important to talk with your health care provider about some of the most common conditions that affect postmenopausal women, such as heart disease, cancer, and bone loss (osteoporosis). Adopting a healthy lifestyle and getting preventive care can help to promote your health and wellness. Those actions can also lower your chances of developing some of these common conditions. What should I know about menopause? During menopause, you may experience a number of symptoms, such as:  Moderate-to-severe hot flashes.  Night sweats.  Decrease in sex drive.  Mood swings.  Headaches.  Tiredness.  Irritability.  Memory problems.  Insomnia. Choosing to treat or not to treat menopausal changes is an individual decision that you make with your health care provider. What should I know about hormone replacement therapy and supplements? Hormone therapy products are effective for treating symptoms that are associated with menopause, such as hot flashes and night sweats. Hormone replacement carries certain risks, especially as you become older. If you are thinking about using estrogen or estrogen with progestin treatments, discuss the benefits and risks with your health care provider. What should I know about heart disease and stroke? Heart disease, heart attack, and stroke become more likely as you age. This may be due, in part, to the hormonal changes that your body experiences during menopause. These can affect how your body processes dietary fats, triglycerides, and cholesterol. Heart attack and stroke are both medical emergencies. There are many things that you can do to help prevent heart disease  and stroke:  Have your blood pressure checked at least every 1-2 years. High blood pressure causes heart disease and increases the risk of stroke.  If you are 55-57 years old, ask your health care provider if you should take aspirin to prevent a heart attack or a stroke.  Do not use any tobacco products, including cigarettes, chewing tobacco, or electronic cigarettes. If you need help quitting, ask your health care provider.  It is important to eat a healthy diet and maintain a healthy weight. ? Be sure to include plenty of vegetables, fruits, low-fat dairy products, and lean protein. ? Avoid eating foods that are high in solid fats, added sugars, or salt (sodium).  Get regular exercise. This is one of the most important things that you can do for your health. ? Try to exercise for at least 150 minutes each week. The type of exercise that you do should increase your heart rate and make you sweat. This is known as moderate-intensity exercise. ? Try to do strengthening exercises at least twice each week. Do these in addition to the moderate-intensity exercise.  Know your numbers.Ask your health care provider to check your cholesterol and your blood glucose. Continue to have your blood tested as directed by your health care provider.  What should I know about cancer screening? There are several types of cancer. Take the following steps to reduce your risk and to catch any cancer development as early as possible. Breast Cancer  Practice breast self-awareness. ? This means understanding how your breasts normally appear and feel. ? It also means doing regular breast self-exams. Let your health care provider know about any changes, no matter how small.  If you are 40 or  older, have a clinician do a breast exam (clinical breast exam or CBE) every year. Depending on your age, family history, and medical history, it may be recommended that you also have a yearly breast X-ray (mammogram).  If you  have a family history of breast cancer, talk with your health care provider about genetic screening.  If you are at high risk for breast cancer, talk with your health care provider about having an MRI and a mammogram every year.  Breast cancer (BRCA) gene test is recommended for women who have family members with BRCA-related cancers. Results of the assessment will determine the need for genetic counseling and BRCA1 and for BRCA2 testing. BRCA-related cancers include these types: ? Breast. This occurs in males or females. ? Ovarian. ? Tubal. This may also be called fallopian tube cancer. ? Cancer of the abdominal or pelvic lining (peritoneal cancer). ? Prostate. ? Pancreatic. Cervical, Uterine, and Ovarian Cancer Your health care provider may recommend that you be screened regularly for cancer of the pelvic organs. These include your ovaries, uterus, and vagina. This screening involves a pelvic exam, which includes checking for microscopic changes to the surface of your cervix (Pap test).  For women ages 21-65, health care providers may recommend a pelvic exam and a Pap test every three years. For women ages 23-65, they may recommend the Pap test and pelvic exam, combined with testing for human papilloma virus (HPV), every five years. Some types of HPV increase your risk of cervical cancer. Testing for HPV may also be done on women of any age who have unclear Pap test results.  Other health care providers may not recommend any screening for nonpregnant women who are considered low risk for pelvic cancer and have no symptoms. Ask your health care provider if a screening pelvic exam is right for you.  If you have had past treatment for cervical cancer or a condition that could lead to cancer, you need Pap tests and screening for cancer for at least 20 years after your treatment. If Pap tests have been discontinued for you, your risk factors (such as having a new sexual partner) need to be reassessed  to determine if you should start having screenings again. Some women have medical problems that increase the chance of getting cervical cancer. In these cases, your health care provider may recommend that you have screening and Pap tests more often.  If you have a family history of uterine cancer or ovarian cancer, talk with your health care provider about genetic screening.  If you have vaginal bleeding after reaching menopause, tell your health care provider.  There are currently no reliable tests available to screen for ovarian cancer. Lung Cancer Lung cancer screening is recommended for adults 29-69 years old who are at high risk for lung cancer because of a history of smoking. A yearly low-dose CT scan of the lungs is recommended if you:  Currently smoke.  Have a history of at least 30 pack-years of smoking and you currently smoke or have quit within the past 15 years. A pack-year is smoking an average of one pack of cigarettes per day for one year. Yearly screening should:  Continue until it has been 15 years since you quit.  Stop if you develop a health problem that would prevent you from having lung cancer treatment. Colorectal Cancer  This type of cancer can be detected and can often be prevented.  Routine colorectal cancer screening usually begins at age 54 and continues through  age 63.  If you have risk factors for colon cancer, your health care provider may recommend that you be screened at an earlier age.  If you have a family history of colorectal cancer, talk with your health care provider about genetic screening.  Your health care provider may also recommend using home test kits to check for hidden blood in your stool.  A small camera at the end of a tube can be used to examine your colon directly (sigmoidoscopy or colonoscopy). This is done to check for the earliest forms of colorectal cancer.  Direct examination of the colon should be repeated every 5-10 years until  age 75. However, if early forms of precancerous polyps or small growths are found or if you have a family history or genetic risk for colorectal cancer, you may need to be screened more often. Skin Cancer  Check your skin from head to toe regularly.  Monitor any moles. Be sure to tell your health care provider: ? About any new moles or changes in moles, especially if there is a change in a mole's shape or color. ? If you have a mole that is larger than the size of a pencil eraser.  If any of your family members has a history of skin cancer, especially at a young age, talk with your health care provider about genetic screening.  Always use sunscreen. Apply sunscreen liberally and repeatedly throughout the day.  Whenever you are outside, protect yourself by wearing long sleeves, pants, a wide-brimmed hat, and sunglasses. What should I know about osteoporosis? Osteoporosis is a condition in which bone destruction happens more quickly than new bone creation. After menopause, you may be at an increased risk for osteoporosis. To help prevent osteoporosis or the bone fractures that can happen because of osteoporosis, the following is recommended:  If you are 59-59 years old, get at least 1,000 mg of calcium and at least 600 mg of vitamin D per day.  If you are older than age 36 but younger than age 32, get at least 1,200 mg of calcium and at least 600 mg of vitamin D per day.  If you are older than age 47, get at least 1,200 mg of calcium and at least 800 mg of vitamin D per day. Smoking and excessive alcohol intake increase the risk of osteoporosis. Eat foods that are rich in calcium and vitamin D, and do weight-bearing exercises several times each week as directed by your health care provider. What should I know about how menopause affects my mental health? Depression may occur at any age, but it is more common as you become older. Common symptoms of depression include:  Low or sad mood.   Changes in sleep patterns.  Changes in appetite or eating patterns.  Feeling an overall lack of motivation or enjoyment of activities that you previously enjoyed.  Frequent crying spells. Talk with your health care provider if you think that you are experiencing depression. What should I know about immunizations? It is important that you get and maintain your immunizations. These include:  Tetanus, diphtheria, and pertussis (Tdap) booster vaccine.  Influenza every year before the flu season begins.  Pneumonia vaccine.  Shingles vaccine. Your health care provider may also recommend other immunizations. This information is not intended to replace advice given to you by your health care provider. Make sure you discuss any questions you have with your health care provider. Document Released: 06/24/2005 Document Revised: 11/20/2015 Document Reviewed: 02/03/2015 Elsevier Interactive Patient Education  2019 [place].

## 2018-11-06 NOTE — Progress Notes (Signed)
Patient ID: Karen Washington, female    DOB: [birth year]  Age: 63 y.o. MRN: [id number]  The patient is here for annual peventive  examination and management of other chronic and acute problems.  The risk factors are reflected in the social history.  The roster of all physicians providing medical care to patient - is listed in the Snapshot section of the chart.  Activities of daily living:  The patient is 100% independent in all ADLs: dressing, toileting, feeding as well as independent mobility  Home safety : The patient has smoke detectors in the home. They wear seatbelts.  There are no firearms at home. There is no violence in the home.   There is no risks for hepatitis, STDs or HIV. There is no   history of blood transfusion. They have no travel history to infectious disease endemic areas of the world.  The patient has seen their dentist in the last six month. They have seen their eye doctor in the last year. They admit to slight hearing difficulty with regard to whispered voices and some television programs.  They have deferred audiologic testing in the last year.  They do not  have excessive sun exposure. Discussed the need for sun protection: hats, long sleeves and use of sunscreen if there is significant sun exposure.   Diet: the importance of a healthy diet is discussed. They do have a healthy diet.  The benefits of regular aerobic exercise were discussed. She walks 4 times per week ,  30 minutes.   Depression screen: there are no signs or vegative symptoms of depression- irritability, change in appetite, anhedonia, sadness/tearfullness.  Cognitive assessment: the patient manages all their financial and personal affairs and is actively engaged. They could relate day,date,year and events; recalled 2/3 objects at 3 minutes; performed clock-face test normally.  The following portions of the patient's history were reviewed and updated as appropriate: allergies, current medications, past family  history, past medical history,  past surgical history, past social history  and problem list.  Visual acuity was not assessed per patient preference since she has regular follow up with her ophthalmologist. Hearing and body mass index were assessed and reviewed.   During the course of the visit the patient was educated and counseled about appropriate screening and preventive services including : fall prevention , diabetes screening, nutrition counseling, colorectal cancer screening, and recommended immunizations.    CC: The primary encounter diagnosis was Sciatica of right side associated with disorder of lumbar spine. Diagnoses of Fatigue, unspecified type and Encounter for preventive health examination were also pertinent to this visit.  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  Diagnosed in 2014, treated with lumpectomy/chemo/XRT. No recurrence  Annual surveillance mammograms by [facility]  August , annual oncology follow up.  PAP smear normal  2019 .  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 She/they were very careful, did not eat out, and practiced social distancing. .  She has  no signs or symptoms of COVID 19 infection (fever, cough, sore throat  or shortness of breath ).  Patient denies contact with other persons with the above mentioned symptoms or with anyone confirmed to have COVID 19 .    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  Her pain has localized to th right with sciatica right leg, but is  now improving . She denies foot drop , leg weakness and numbness     History Der has a past medical history of Breast  cancer of upper-inner quadrant of right female breast ([place]) (11/2012) and Shingles (2014).   She has a past surgical history that includes Breast surgery (Right, December 13 2012); Portacath placement (2014); Mastopexy (Left, Nov 2015); Port-a-cath removal (02-05-14); Colonoscopy (2011); Breast lumpectomy (Right, 12/13/2012); and Breast biopsy (Right, 11/22/2012).   Her family history includes Breast cancer in her maternal grandmother; Cancer in her father and mother.She reports that she has never smoked. She has never used smokeless tobacco. She reports current alcohol use. She reports that she does not use drugs.  Outpatient Medications Prior to Visit  Medication Sig Dispense Refill  . ALPRAZolam (XANAX) 0.25 MG tablet Take 1 tablet (0.25 mg total) by mouth at bedtime as needed for sleep. 30 tablet 1  . cholecalciferol (VITAMIN D) 1000 units tablet Take 1,000 Units by mouth daily.    . naproxen sodium (ALEVE) 220 MG tablet Take 220 mg by mouth daily as needed.     No facility-administered medications prior to visit.     Review of Systems   Patient denies headache, fevers, malaise, unintentional weight loss, skin rash, eye pain, sinus congestion and sinus pain, sore throat, dysphagia,  hemoptysis , cough, dyspnea, wheezing, chest pain, palpitations, orthopnea, edema, abdominal pain, nausea, melena, diarrhea, constipation, flank pain, dysuria, hematuria, urinary  Frequency, nocturia, numbness, tingling, seizures,  Focal weakness, Loss of consciousness,  Tremor, insomnia, depression, anxiety, and suicidal ideation.      Objective:  BP 106/78 (BP Location: Left Arm, Patient Position: Sitting, Cuff Size: Normal)   Pulse 68   Temp 98.8 F (37.1 C) (Oral)   Resp 15   Ht '5\' 5"'  (1.651 m)   Wt 138 lb (62.6 kg)   SpO2 97%   BMI 22.96 kg/m   Physical Exam   General appearance: alert, cooperative and appears stated age Head: Normocephalic, without obvious abnormality, atraumatic Eyes:  conjunctivae/corneas clear. PERRL, EOM's intact. Fundi benign. Ears: normal TM's and external ear canals both ears Nose: Nares normal. Septum midline. Mucosa normal. No drainage or sinus tenderness. Throat: lips, mucosa, and tongue normal; teeth and gums normal Neck: no adenopathy, no carotid bruit, no JVD, supple, symmetrical, trachea midline and thyroid not enlarged, symmetric, no tenderness/mass/nodules Lungs: clear to auscultation bilaterally Breasts:Right breast exhibits no inverted nipple, no mass, no nipple discharge, no skin change and no tenderness. Left breast exhibits no inverted nipple, no mass, no nipple discharge, no skin change and no tenderness. Breasts are symmetrical.     Heart: regular rate and rhythm, S1, S2 normal, no murmur, click, rub or gallop Abdomen: soft, non-tender; bowel sounds normal; no masses,  no organomegaly Extremities: extremities normal, atraumatic, no cyanosis or edema Pulses: 2+ and symmetric Skin: Skin color, texture, turgor normal. No rashes or lesions Neurologic: Alert and oriented X 3, normal strength and tone. Normal symmetric reflexes. Normal coordination and gait.     Assessment & Plan:   Problem List Items Addressed This Visit    Encounter for preventive health examination    age appropriate education and counseling updated, referrals for preventative services and immunizations addressed, dietary  and smoking counseling addressed, most recent labs reviewed.  I have personally reviewed and have noted:  1) the patient's medical and social history 2) The pt's use of alcohol, tobacco, and illicit drugs 3) The patient's current medications and supplements 4) Functional ability including ADL's, fall risk, home safety risk, hearing and visual impairment 5) Diet and physical activities 6) Evidence for depression or mood disorder 7) The patient's height, weight, and BMI have been recorded in the chart  I have made referrals, and provided  counseling and education based on review of the above      Relevant Orders   Lipid panel (Completed)   Sciatica of right side associated with disorder of lumbar spine - Primary    Precipitated by increased physical exertion associated with caregiving.  Improving.. baslinee imaging ordered       Relevant Orders   DG Lumbar Spine Complete    Other Visit Diagnoses    Fatigue, unspecified type       Relevant Orders   Comprehensive metabolic panel (Completed)   TSH (Completed)   CBC with Differential/Platelet (Completed)      I am having Yaneth C. Degen maintain her cholecalciferol, ALPRAZolam, and naproxen sodium.  No orders of the defined types were placed in this encounter.   There are no discontinued medications.  Follow-up: No follow-ups on file.   Crecencio Mc, MD

## 2018-11-06 NOTE — Assessment & Plan Note (Signed)

## 2018-12-04 ENCOUNTER — Encounter: Payer: Self-pay | Admitting: General Surgery

## 2018-12-04 ENCOUNTER — Other Ambulatory Visit: Payer: Self-pay | Admitting: Internal Medicine

## 2018-12-04 DIAGNOSIS — Z1231 Encounter for screening mammogram for malignant neoplasm of breast: Secondary | ICD-10-CM

## 2018-12-26 ENCOUNTER — Telehealth: Payer: Self-pay | Admitting: Internal Medicine

## 2019-01-23 ENCOUNTER — Ambulatory Visit
Admission: RE | Admit: 2019-01-23 | Discharge: 2019-01-23 | Disposition: A | Discharge status: Home / Self Care | Payer: BC Managed Care – PPO | Source: Ambulatory Visit | Attending: Internal Medicine | Admitting: Internal Medicine

## 2019-01-23 DIAGNOSIS — Z1231 Encounter for screening mammogram for malignant neoplasm of breast: Secondary | ICD-10-CM | POA: Diagnosis present

## 2019-01-27 ENCOUNTER — Other Ambulatory Visit: Payer: Self-pay | Admitting: Internal Medicine

## 2019-01-27 DIAGNOSIS — Z170 Estrogen receptor positive status [ER+]: Secondary | ICD-10-CM

## 2019-01-27 DIAGNOSIS — R591 Generalized enlarged lymph nodes: Secondary | ICD-10-CM

## 2019-01-27 DIAGNOSIS — C50211 Malignant neoplasm of upper-inner quadrant of right female breast: Secondary | ICD-10-CM

## 2019-01-27 DIAGNOSIS — R5383 Other fatigue: Secondary | ICD-10-CM

## 2019-01-27 DIAGNOSIS — E559 Vitamin D deficiency, unspecified: Secondary | ICD-10-CM

## 2019-02-01 ENCOUNTER — Ambulatory Visit: Payer: BC Managed Care – PPO | Admitting: Surgery

## 2019-02-13 ENCOUNTER — Other Ambulatory Visit: Payer: Self-pay

## 2019-02-13 ENCOUNTER — Other Ambulatory Visit (INDEPENDENT_AMBULATORY_CARE_PROVIDER_SITE_OTHER): Payer: BC Managed Care – PPO

## 2019-02-13 DIAGNOSIS — E559 Vitamin D deficiency, unspecified: Secondary | ICD-10-CM | POA: Diagnosis not present

## 2019-02-13 DIAGNOSIS — Z23 Encounter for immunization: Secondary | ICD-10-CM

## 2019-02-13 DIAGNOSIS — Z170 Estrogen receptor positive status [ER+]: Secondary | ICD-10-CM

## 2019-02-13 DIAGNOSIS — C50211 Malignant neoplasm of upper-inner quadrant of right female breast: Secondary | ICD-10-CM

## 2019-02-13 DIAGNOSIS — R5383 Other fatigue: Secondary | ICD-10-CM | POA: Diagnosis not present

## 2019-02-13 DIAGNOSIS — R591 Generalized enlarged lymph nodes: Secondary | ICD-10-CM | POA: Diagnosis not present

## 2019-02-13 LAB — COMPREHENSIVE METABOLIC PANEL
ALT: 16 U/L (ref 0–35)
AST: 17 U/L (ref 0–37)
Albumin: 4.7 g/dL (ref 3.5–5.2)
Alkaline Phosphatase: 48 U/L (ref 39–117)
BUN: 24 mg/dL — ABNORMAL HIGH (ref 6–23)
CO2: 30 mEq/L (ref 19–32)
Calcium: 9.9 mg/dL (ref 8.4–10.5)
Chloride: 101 mEq/L (ref 96–112)
Creatinine, Ser: 1.03 mg/dL (ref 0.40–1.20)
GFR: 54.41 mL/min — ABNORMAL LOW (ref >60.00)
Glucose, Bld: 85 mg/dL (ref 70–99)
Potassium: 4.4 mEq/L (ref 3.5–5.1)
Sodium: 140 mEq/L (ref 135–145)
Total Bilirubin: 0.7 mg/dL (ref 0.2–1.2)
Total Protein: 6.9 g/dL (ref 6.0–8.3)

## 2019-02-13 LAB — CBC WITH DIFFERENTIAL/PLATELET
Basophils Absolute: 0 10*3/uL (ref 0.0–0.1)
Basophils Relative: 0.7 % (ref 0.0–3.0)
Eosinophils Absolute: 0.1 10*3/uL (ref 0.0–0.7)
Eosinophils Relative: 1.4 % (ref 0.0–5.0)
HCT: 42.8 % (ref 36.0–46.0)
Hemoglobin: 14.2 g/dL (ref 12.0–15.0)
Lymphocytes Relative: 18.8 % (ref 12.0–46.0)
Lymphs Abs: 1.1 10*3/uL (ref 0.7–4.0)
MCHC: 33.2 g/dL (ref 30.0–36.0)
MCV: 94.5 fl (ref 78.0–100.0)
Monocytes Absolute: 0.4 10*3/uL (ref 0.1–1.0)
Monocytes Relative: 7.3 % (ref 3.0–12.0)
Neutro Abs: 4.2 10*3/uL (ref 1.4–7.7)
Neutrophils Relative %: 71.8 % (ref 43.0–77.0)
Platelets: 258 10*3/uL (ref 150.0–400.0)
RBC: 4.53 Mil/uL (ref 3.87–5.11)
RDW: 13 % (ref 11.5–15.5)
WBC: 5.9 10*3/uL (ref 4.0–10.5)

## 2019-02-13 LAB — VITAMIN D 25 HYDROXY (VIT D DEFICIENCY, FRACTURES): VITD: 40.91 ng/mL (ref 30.00–100.00)

## 2019-02-14 ENCOUNTER — Other Ambulatory Visit: Payer: Self-pay | Admitting: Internal Medicine

## 2019-02-14 DIAGNOSIS — R944 Abnormal results of kidney function studies: Secondary | ICD-10-CM

## 2019-02-14 LAB — CANCER ANTIGEN 27.29: CA 27.29: 16 U/mL (ref <38)

## 2019-03-07 ENCOUNTER — Other Ambulatory Visit (INDEPENDENT_AMBULATORY_CARE_PROVIDER_SITE_OTHER): Payer: BC Managed Care – PPO

## 2019-03-07 ENCOUNTER — Ambulatory Visit (INDEPENDENT_AMBULATORY_CARE_PROVIDER_SITE_OTHER): Payer: BC Managed Care – PPO | Admitting: Vascular Surgery

## 2019-03-12 ENCOUNTER — Telehealth: Payer: Self-pay | Admitting: *Deleted

## 2019-03-12 NOTE — Telephone Encounter (Signed)
Patient contacted the cancer center to cnl her upcoming apt with Dr. Rogue Bussing. Dr. Derrel Nip drew a ca27.29 level which was 16.0.  For this reason, she would like to cnl her apts with Dr. Jacinto Reap. She would like to continue her care under Dr. Derrel Nip. She prefers that a ca27.29 be drawn yearly under the care of the pcp.  Dr. B if you agree, please let me know. Pt will wait on a mychart msg from our clinic with Dr. Aletha Halim recommendations.

## 2019-03-18 ENCOUNTER — Ambulatory Visit: Payer: BC Managed Care – PPO | Admitting: Internal Medicine

## 2019-03-18 ENCOUNTER — Other Ambulatory Visit: Payer: BC Managed Care – PPO

## 2019-03-21 NOTE — Telephone Encounter (Signed)
Dr. B please advise if this plan of care is ok.

## 2019-03-21 NOTE — Telephone Encounter (Signed)
Heather-as per patient's preference, its ok with me for pt to continue her care with Dr. Derrel Nip.  Recommend annual mammogram/breast exam; as per patient's preference CA 27-29 on a yearly basis.   Dr. Howie Ill let me know if this is okay with you; p atient follow-up with me as needed.

## 2019-03-21 NOTE — Telephone Encounter (Signed)
Ok with me 

## 2019-03-25 ENCOUNTER — Ambulatory Visit (INDEPENDENT_AMBULATORY_CARE_PROVIDER_SITE_OTHER): Payer: BC Managed Care – PPO

## 2019-03-25 ENCOUNTER — Other Ambulatory Visit (INDEPENDENT_AMBULATORY_CARE_PROVIDER_SITE_OTHER): Payer: Self-pay | Admitting: Vascular Surgery

## 2019-03-25 ENCOUNTER — Ambulatory Visit (INDEPENDENT_AMBULATORY_CARE_PROVIDER_SITE_OTHER): Payer: BC Managed Care – PPO | Admitting: Vascular Surgery

## 2019-03-25 ENCOUNTER — Encounter (INDEPENDENT_AMBULATORY_CARE_PROVIDER_SITE_OTHER): Payer: Self-pay | Admitting: Vascular Surgery

## 2019-03-25 ENCOUNTER — Other Ambulatory Visit: Payer: Self-pay

## 2019-03-25 VITALS — BP 121/76 | HR 66 | Resp 17 | Ht 65.0 in | Wt 139.0 lb

## 2019-03-25 DIAGNOSIS — Z170 Estrogen receptor positive status [ER+]: Secondary | ICD-10-CM | POA: Diagnosis not present

## 2019-03-25 DIAGNOSIS — I720 Aneurysm of carotid artery: Secondary | ICD-10-CM

## 2019-03-25 DIAGNOSIS — M5386 Other specified dorsopathies, lumbar region: Secondary | ICD-10-CM | POA: Diagnosis not present

## 2019-03-25 DIAGNOSIS — C50211 Malignant neoplasm of upper-inner quadrant of right female breast: Secondary | ICD-10-CM

## 2019-03-25 DIAGNOSIS — I721 Aneurysm of artery of upper extremity: Secondary | ICD-10-CM | POA: Diagnosis not present

## 2019-03-29 ENCOUNTER — Encounter (INDEPENDENT_AMBULATORY_CARE_PROVIDER_SITE_OTHER): Payer: Self-pay | Admitting: Vascular Surgery

## 2019-03-29 NOTE — Progress Notes (Signed)
MRN : [id number]  Karen Washington is a 63 y.o. ([birth year]) female who presents with chief complaint of  Chief Complaint  Patient presents with   Follow-up  .  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  The patient is seen for follow up evaluation ofan aneurysm of a branch of the subclavian artery. Theaneurysm isfollowed by ultrasound.  The patient denies pain in the left arm or changes to the left fingers. No ulcers of the hand have occurred. The patient denies amaurosis fugax. There is no recent history of TIA symptoms or focal motor deficits. There is no prior documented CVA.  The patient is taking enteric-coated aspirin 81 mg daily.  There is no history of migraine headaches. There is no history of seizures.  The patient has a history of coronary artery disease, no recent episodes of angina or shortness of breath. The patient denies PAD or claudication symptoms. There is a history of hyperlipidemia which is being treated with a statin.  Left arm duplex done today showsthe aneurysm is 0.22 cm. No change compared to last study  Current Meds  Medication Sig   ALPRAZolam (XANAX) 0.25 MG tablet Take 1 tablet (0.25 mg total) by mouth at bedtime as needed for sleep.   cholecalciferol (VITAMIN D) 1000 units tablet Take 1,000 Units by mouth daily.   naproxen sodium (ALEVE) 220 MG tablet Take 220 mg by mouth daily as needed.    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 Whole breast radiation.   Adjuvant chemotherapy with Docetaxel/Carboplatin/Herceptin starting on 01/15/13, completed 6 cycles of Docetaxel/Carboplatin on 04/29/13.  Completed 52 weeks of Herceptin on 01/06/14.   Personal history of chemotherapy    Personal  history of radiation therapy    Shingles 2014    Past Surgical History:  Procedure Laterality Date   BREAST BIOPSY Right 11/22/2012   INVASIVE MAMMARY CARCINOMA, NO SPECIAL TYPE, DCIS   BREAST LUMPECTOMY Right 12/13/2012   invasive mammary carcinoma and DCIS. Clear margins   BREAST SURGERY Right December 13 2012   lumpectomy   COLONOSCOPY  2011   MASTOPEXY Left Nov 2015   Dr Tula Nakayama   [facility] REMOVAL  02-05-14   Dr Bary Castilla   PORTACATH PLACEMENT  2014    Social History Social History   Tobacco Use   Smoking status: Never Smoker   Smokeless tobacco: Never Used  Substance Use Topics   Alcohol use: Yes   Drug use: No    Family History Family History  Problem Relation Age of Onset   Cancer Mother        uterine    Breast cancer Maternal Grandmother    Cancer Father        prostate    No Known Allergies   REVIEW OF SYSTEMS (Negative unless checked)  Constitutional: [] Weight loss  [] Fever  [] Chills Cardiac: [] Chest pain   [] Chest pressure   [] Palpitations   [] Shortness of breath when laying flat   [] Shortness of breath with exertion. Vascular:  [] Pain in legs with walking   [] Pain in legs at rest  [] History of DVT   [] Phlebitis   [] Swelling in legs   [] Varicose veins   [] Non-healing ulcers Pulmonary:   [] Uses home  oxygen   [] Productive cough   [] Hemoptysis   [] Wheeze  [] COPD   [] Asthma Neurologic:  [] Dizziness   [] Seizures   [] History of stroke   [] History of TIA  [] Aphasia   [] Vissual changes   [] Weakness or numbness in arm   [] Weakness or numbness in leg Musculoskeletal:   [] Joint swelling   [] Joint pain   [] Low back pain Hematologic:  [] Easy bruising  [] Easy bleeding   [] Hypercoagulable state   [] Anemic Gastrointestinal:  [] Diarrhea   [] Vomiting  [] Gastroesophageal reflux/heartburn   [] Difficulty swallowing. Genitourinary:  [] Chronic kidney disease   [] Difficult urination  [] Frequent urination   [] Blood in urine Skin:  [] Rashes   [] Ulcers  Psychological:   [] History of anxiety   []  History of major depression.  Physical Examination  Vitals:   03/25/19 1109  BP: 121/76  Pulse: 66  Resp: 17  Weight: 139 lb (63 kg)  Height: 5\' 5"  (1.651 m)   Body mass index is 23.13 kg/m. Gen: WD/WN, NAD Head: /AT, No temporalis wasting.  Ear/Nose/Throat: Hearing grossly intact, nares w/o erythema or drainage Eyes: PER, EOMI, sclera nonicteric.  Neck: Supple, no large masses.   Pulmonary:  Good air movement, no audible wheezing bilaterally, no use of accessory muscles.  Cardiac: RRR, no JVD Vascular:  Vessel Right Left  Radial Palpable Palpable  Brachial Palpable Palpable  Carotid Palpable Palpable  Gastrointestinal: Non-distended. No guarding/no peritoneal signs.  Musculoskeletal: M/S 5/5 throughout.  No deformity or atrophy.  Neurologic: CN 2-12 intact. Symmetrical.  Speech is fluent. Motor exam as listed above. Psychiatric: Judgment intact, Mood & affect appropriate for pt's clinical situation. Dermatologic: No rashes or ulcers noted.  No changes consistent with cellulitis. Lymph : No lichenification or skin changes of chronic lymphedema.  CBC Lab Results  Component Value Date   WBC 5.9 02/13/2019   HGB 14.2 02/13/2019   HCT 42.8 02/13/2019   MCV 94.5 02/13/2019   PLT 258.0 02/13/2019    BMET    Component Value Date/Time   NA 140 02/13/2019 0959   NA 142 01/06/2014 1338   K 4.4 02/13/2019 0959   K 3.8 01/06/2014 1338   CL 101 02/13/2019 0959   CL 106 01/06/2014 1338   CO2 30 02/13/2019 0959   CO2 31 01/06/2014 1338   GLUCOSE 85 02/13/2019 0959   GLUCOSE 80 01/06/2014 1338   BUN 24 (H) 02/13/2019 0959   BUN 16 01/06/2014 1338   CREATININE 1.03 02/13/2019 0959   CREATININE 0.87 08/29/2014 1053   CALCIUM 9.9 02/13/2019 0959   CALCIUM 9.2 01/06/2014 1338   GFRNONAA >60 03/16/2017 1035   GFRNONAA >60 08/29/2014 1053   GFRAA >60 03/16/2017 1035   GFRAA >60 08/29/2014 1053   CrCl cannot be calculated (Patient's most recent  lab result is older than the maximum 21 days allowed.).  COAG No results found for: INR, PROTIME  Radiology Vas US Carotid  Result Date: 03/25/2019 Carotid Arterial Duplex Study Other Factors:     Left neck mass/aneurysm of collateral mid CCA branch. Comparison Study:  03/05/2018 Performing Technologist: Concha Norway RVT  Examination Guidelines: A complete evaluation includes B-mode imaging, spectral Doppler, color Doppler, and power Doppler as needed of all accessible portions of each vessel. Bilateral testing is considered an integral part of a complete examination. Limited examinations for reoccurring indications may be performed as noted.  Left Carotid Findings: +-------+--------+--------+--------+------------------+--------+          PSV cm/s EDV cm/s Stenosis Plaque Description Comments  +-------+--------+--------+--------+------------------+--------+  CCA  Mid 80       23                                             +-------+--------+--------+--------+------------------+--------+  ICA Mid 38       18                                             +-------+--------+--------+--------+------------------+--------+  ECA     64       4                                              +-------+--------+--------+--------+------------------+--------+ +----------+--------+--------+----------------+-------------------+             PSV cm/s EDV cm/s Describe         Arm Pressure (mmHG)  +----------+--------+--------+----------------+-------------------+  Subclavian 122               Multiphasic, WNL                      +----------+--------+--------+----------------+-------------------+ +---------+--------+--+--------+---------+  Vertebral PSV cm/s 76 EDV cm/s Antegrade  +---------+--------+--+--------+---------+  Summary:  Left Carotid: There is no evidence of stenosis in the left ICA. Left neck               evaluated for followup of mass/?aneurysm of additional anamolous               branch off CCA. The area of  concern appears smaller than previous               study. The area now measures .5 x .4cm with the same added branch               feeding this area. The previous study showed a 1cm aneurysm/mass. Vertebrals:  Left vertebral artery demonstrates antegrade flow. Subclavians: Normal flow hemodynamics were seen in the left subclavian artery. *See table(s) above for measurements and observations.  Electronically signed by Hortencia Pilar MD on 03/25/2019 at 4:57:55 PM.   Final      Assessment/Plan 1. 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  Duplex ultrasound showsthe aneurysm is small and does not appear to have grown.  Continue antiplatelet therapy as prescribed Continue management ofall comorbidities Healthy heart diet, encouraged exercise at least 4 times per week  Follow up in25months with duplex ultrasound and physical exam - Carotid; Future  2. Sciatica of right side associated with disorder of lumbar spine Continue NSAID medications as already ordered, these medications have been reviewed and there are no changes at this time.  Continued activity and therapy was stressed.   3. Carcinoma of upper-inner quadrant of right breast in female, estrogen receptor positive ([place]) Continue follow up with Dr Erick Blinks, MD  03/29/2019 12:05 PM

## 2019-11-07 ENCOUNTER — Encounter: Payer: BC Managed Care – PPO | Admitting: Internal Medicine

## 2019-11-19 ENCOUNTER — Ambulatory Visit (INDEPENDENT_AMBULATORY_CARE_PROVIDER_SITE_OTHER): Payer: BC Managed Care – PPO | Admitting: Internal Medicine

## 2019-11-19 ENCOUNTER — Encounter: Payer: Self-pay | Admitting: Internal Medicine

## 2019-11-19 ENCOUNTER — Other Ambulatory Visit: Payer: Self-pay

## 2019-11-19 VITALS — BP 104/72 | HR 68 | Temp 98.0°F | Resp 14 | Ht 65.0 in | Wt 134.2 lb

## 2019-11-19 DIAGNOSIS — Z780 Asymptomatic menopausal state: Secondary | ICD-10-CM | POA: Diagnosis not present

## 2019-11-19 DIAGNOSIS — R5383 Other fatigue: Secondary | ICD-10-CM

## 2019-11-19 DIAGNOSIS — Z1231 Encounter for screening mammogram for malignant neoplasm of breast: Secondary | ICD-10-CM

## 2019-11-19 DIAGNOSIS — Z853 Personal history of malignant neoplasm of breast: Secondary | ICD-10-CM

## 2019-11-19 DIAGNOSIS — Z0000 Encounter for general adult medical examination without abnormal findings: Secondary | ICD-10-CM | POA: Diagnosis not present

## 2019-11-19 DIAGNOSIS — E559 Vitamin D deficiency, unspecified: Secondary | ICD-10-CM | POA: Diagnosis not present

## 2019-11-19 DIAGNOSIS — I721 Aneurysm of artery of upper extremity: Secondary | ICD-10-CM

## 2019-11-19 DIAGNOSIS — Z13820 Encounter for screening for osteoporosis: Secondary | ICD-10-CM

## 2019-11-19 LAB — COMPREHENSIVE METABOLIC PANEL
ALT: 14 U/L (ref 0–35)
AST: 17 U/L (ref 0–37)
Albumin: 4.4 g/dL (ref 3.5–5.2)
Alkaline Phosphatase: 52 U/L (ref 39–117)
BUN: 18 mg/dL (ref 6–23)
CO2: 31 mEq/L (ref 19–32)
Calcium: 9.4 mg/dL (ref 8.4–10.5)
Chloride: 99 mEq/L (ref 96–112)
Creatinine, Ser: 0.9 mg/dL (ref 0.40–1.20)
GFR: 63.42 mL/min (ref >60.00)
Glucose, Bld: 82 mg/dL (ref 70–99)
Potassium: 4 mEq/L (ref 3.5–5.1)
Sodium: 139 mEq/L (ref 135–145)
Total Bilirubin: 0.8 mg/dL (ref 0.2–1.2)
Total Protein: 6.5 g/dL (ref 6.0–8.3)

## 2019-11-19 LAB — CBC WITH DIFFERENTIAL/PLATELET
Basophils Absolute: 0 10*3/uL (ref 0.0–0.1)
Basophils Relative: 0.6 % (ref 0.0–3.0)
Eosinophils Absolute: 0.1 10*3/uL (ref 0.0–0.7)
Eosinophils Relative: 1 % (ref 0.0–5.0)
HCT: 42.3 % (ref 36.0–46.0)
Hemoglobin: 14.1 g/dL (ref 12.0–15.0)
Lymphocytes Relative: 24.2 % (ref 12.0–46.0)
Lymphs Abs: 1.4 10*3/uL (ref 0.7–4.0)
MCHC: 33.3 g/dL (ref 30.0–36.0)
MCV: 94.1 fl (ref 78.0–100.0)
Monocytes Absolute: 0.6 10*3/uL (ref 0.1–1.0)
Monocytes Relative: 9.5 % (ref 3.0–12.0)
Neutro Abs: 3.9 10*3/uL (ref 1.4–7.7)
Neutrophils Relative %: 64.7 % (ref 43.0–77.0)
Platelets: 252 10*3/uL (ref 150.0–400.0)
RBC: 4.49 Mil/uL (ref 3.87–5.11)
RDW: 13.1 % (ref 11.5–15.5)
WBC: 6 10*3/uL (ref 4.0–10.5)

## 2019-11-19 LAB — TSH: TSH: 0.97 u[IU]/mL (ref 0.35–4.50)

## 2019-11-19 LAB — LIPID PANEL
Cholesterol: 197 mg/dL (ref 0–200)
HDL: 92.6 mg/dL (ref >39.00)
LDL Cholesterol: 96 mg/dL (ref 0–99)
NonHDL: 104.84
Total CHOL/HDL Ratio: 2
Triglycerides: 46 mg/dL (ref 0.0–149.0)
VLDL: 9.2 mg/dL (ref 0.0–40.0)

## 2019-11-19 LAB — VITAMIN D 25 HYDROXY (VIT D DEFICIENCY, FRACTURES): VITD: 44.02 ng/mL (ref 30.00–100.00)

## 2019-11-19 NOTE — Patient Instructions (Addendum)
I recommend trying melatonin for your insomnia.  It is not a sedative,  But must be taken on  a regular basis to help your internal clock.  Take every evening,  after dinner , start with 5 mg dose   Max effective dose is 10 mg   Your annual mammogram has been ordered and is due on or after Sept 10 2021.  You are encouraged (required) to call to make your appointment at [facility]    Health Maintenance for Postmenopausal Women Menopause is a normal process in which your ability to get pregnant comes to an end. This process happens slowly over many months or years, usually between the ages of 62 and 24. Menopause is complete when you have missed your menstrual periods for 12 months. It is important to talk with your health care provider about some of the most common conditions that affect women after menopause (postmenopausal women). These include heart disease, cancer, and bone loss (osteoporosis). Adopting a healthy lifestyle and getting preventive care can help to promote your health and wellness. The actions you take can also lower your chances of developing some of these common conditions. What should I know about menopause? During menopause, you may get a number of symptoms, such as:  Hot flashes. These can be moderate or severe.  Night sweats.  Decrease in sex drive.  Mood swings.  Headaches.  Tiredness.  Irritability.  Memory problems.  Insomnia. Choosing to treat or not to treat these symptoms is a decision that you make with your health care provider. Do I need hormone replacement therapy?  Hormone replacement therapy is effective in treating symptoms that are caused by menopause, such as hot flashes and night sweats.  Hormone replacement carries certain risks, especially as you become older. If you are thinking about using estrogen or estrogen with progestin, discuss the benefits and risks with your health care provider. What is my risk for heart disease and  stroke? The risk of heart disease, heart attack, and stroke increases as you age. One of the causes may be a change in the body's hormones during menopause. This can affect how your body uses dietary fats, triglycerides, and cholesterol. Heart attack and stroke are medical emergencies. There are many things that you can do to help prevent heart disease and stroke. Watch your blood pressure  High blood pressure causes heart disease and increases the risk of stroke. This is more likely to develop in people who have high blood pressure readings, are of African descent, or are overweight.  Have your blood pressure checked: ? Every 3-5 years if you are 34-83 years of age. ? Every year if you are 36 years old or older. Eat a healthy diet   Eat a diet that includes plenty of vegetables, fruits, low-fat dairy products, and lean protein.  Do not eat a lot of foods that are high in solid fats, added sugars, or sodium. Get regular exercise Get regular exercise. This is one of the most important things you can do for your health. Most adults should:  Try to exercise for at least 150 minutes each week. The exercise should increase your heart rate and make you sweat (moderate-intensity exercise).  Try to do strengthening exercises at least twice each week. Do these in addition to the moderate-intensity exercise.  Spend less time sitting. Even light physical activity can be beneficial. Other tips  Work with your health care provider to achieve or maintain a healthy weight.  Do  not use any products that contain nicotine or tobacco, such as cigarettes, e-cigarettes, and chewing tobacco. If you need help quitting, ask your health care provider.  Know your numbers. Ask your health care provider to check your cholesterol and your blood sugar (glucose). Continue to have your blood tested as directed by your health care provider. Do I need screening for cancer? Depending on your health history and family  history, you may need to have cancer screening at different stages of your life. This may include screening for:  Breast cancer.  Cervical cancer.  Lung cancer.  Colorectal cancer. What is my risk for osteoporosis? After menopause, you may be at increased risk for osteoporosis. Osteoporosis is a condition in which bone destruction happens more quickly than new bone creation. To help prevent osteoporosis or the bone fractures that can happen because of osteoporosis, you may take the following actions:  If you are 19-24 years old, get at least 1,000 mg of calcium and at least 600 mg of vitamin D per day.  If you are older than age 79 but younger than age 27, get at least 1,200 mg of calcium and at least 600 mg of vitamin D per day.  If you are older than age 60, get at least 1,200 mg of calcium and at least 800 mg of vitamin D per day. Smoking and drinking excessive alcohol increase the risk of osteoporosis. Eat foods that are rich in calcium and vitamin D, and do weight-bearing exercises several times each week as directed by your health care provider. How does menopause affect my mental health? Depression may occur at any age, but it is more common as you become older. Common symptoms of depression include:  Low or sad mood.  Changes in sleep patterns.  Changes in appetite or eating patterns.  Feeling an overall lack of motivation or enjoyment of activities that you previously enjoyed.  Frequent crying spells. Talk with your health care provider if you think that you are experiencing depression. General instructions See your health care provider for regular wellness exams and vaccines. This may include:  Scheduling regular health, dental, and eye exams.  Getting and maintaining your vaccines. These include: ? Influenza vaccine. Get this vaccine each year before the flu season begins. ? Pneumonia vaccine. ? Shingles vaccine. ? Tetanus, diphtheria, and pertussis (Tdap) booster  vaccine. Your health care provider may also recommend other immunizations. Tell your health care provider if you have ever been abused or do not feel safe at home. Summary  Menopause is a normal process in which your ability to get pregnant comes to an end.  This condition causes hot flashes, night sweats, decreased interest in sex, mood swings, headaches, or lack of sleep.  Treatment for this condition may include hormone replacement therapy.  Take actions to keep yourself healthy, including exercising regularly, eating a healthy diet, watching your weight, and checking your blood pressure and blood sugar levels.  Get screened for cancer and depression. Make sure that you are up to date with all your vaccines. This information is not intended to replace advice given to you by your health care provider. Make sure you discuss any questions you have with your health care provider. Document Revised: 04/25/2018 Document Reviewed: 04/25/2018 Elsevier Patient Education  2020 [place] ordered as well

## 2019-11-19 NOTE — Assessment & Plan Note (Signed)
mammogram ordered again

## 2019-11-19 NOTE — Assessment & Plan Note (Signed)

## 2019-11-19 NOTE — Assessment & Plan Note (Addendum)
No recurrence since diagnosis and lumpectmy and treatment.  Continue annual mammogram and CA 27.29

## 2019-11-19 NOTE — Progress Notes (Signed)
Patient ID: Karen Washington, female    DOB: 06/10/1955  Age: 64 y.o. MRN: [id number]  The patient is here for annual  wellness examination and management of other chronic and acute problems.   The risk factors are reflected in the social history.  The roster of all physicians providing medical care to patient - is listed in the Snapshot section of the chart.  Activities of daily living:  The patient is 100% independent in all ADLs: dressing, toileting, feeding as well as independent mobility  Home safety : The patient has smoke detectors in the home. They wear seatbelts.  There are no firearms at home. There is no violence in the home.   There is no risks for hepatitis, STDs or HIV. There is no   history of blood transfusion. They have no travel history to infectious disease endemic areas of the world.  The patient has seen their dentist in the last six month. They have seen their eye doctor in the last year. She denies  slight hearing difficulty with regard to whispered voices and some television programs.  She does  have excessive sun exposure. Discussed the need for sun protection: hats, long sleeves and use of sunscreen if there is significant sun exposure.   Diet: the importance of a healthy diet is discussed. They do have a healthy diet.  The benefits of regular aerobic exercise were discussed. She walks 5 days per week ,  60 minutes.   Depression screen: there are no signs or vegative symptoms of depression- irritability, change in appetite, anhedonia, sadness/tearfullness.  Cognitive assessment: the patient manages all their financial and personal affairs and is actively engaged. They could relate day,date,year and events; recalled 2/3 objects at 3 minutes; performed clock-face test normally.  The following portions of the patient's history were reviewed and updated as appropriate: allergies, current medications, past family history, past medical history,  past surgical history, past  social history  and problem list.  Visual acuity was not assessed per patient preference since she has regular follow up with her ophthalmologist. Hearing and body mass index were assessed and reviewed.   During the course of the visit the patient was educated and counseled about appropriate screening and preventive services including : fall prevention , diabetes screening, nutrition counseling, colorectal cancer screening, and recommended immunizations.    CC: The primary encounter diagnosis was Postmenopausal estrogen deficiency. 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  History Latoi has a past medical history of Breast cancer ([place]), Breast cancer of upper-inner quadrant of right female breast ([place]) (11/2012), Personal history of chemotherapy, Personal history of radiation therapy, and Shingles (2014).   She has a past surgical history that includes Breast surgery (Right, December 13 2012); Portacath placement (2014); Mastopexy (Left, Nov 2015); Port-a-cath removal (02-05-14); Colonoscopy (2011); Breast biopsy (Right, 11/22/2012); and Breast lumpectomy (Right, 12/13/2012).   Her family history includes Breast cancer in her maternal grandmother; Cancer in her father and mother.She reports that she has never smoked. She has never used smokeless tobacco. She reports current alcohol use. She reports that she does not use drugs.  Outpatient Medications Prior to Visit  Medication Sig Dispense Refill  . ALPRAZolam (XANAX) 0.25 MG tablet Take 1 tablet (0.25 mg total) by mouth at bedtime as needed for sleep. 30 tablet 1  .  cholecalciferol (VITAMIN D) 1000 units tablet Take 1,000 Units by mouth daily.    . Misc Natural Products (COSAMIN ASU FOR JOINT HEALTH PO) Take 2  capsules by mouth daily. Pt stated Mon, Wed, Fri she takes 2 capsules and all other days she takes one.    . naproxen sodium (ALEVE) 220 MG tablet Take 220 mg by mouth daily as needed.     No facility-administered medications prior to visit.    Review of Systems   Patient denies headache, fevers, malaise, unintentional weight loss, skin rash, eye pain, sinus congestion and sinus pain, sore throat, dysphagia,  hemoptysis , cough, dyspnea, wheezing, chest pain, palpitations, orthopnea, edema, abdominal pain, nausea, melena, diarrhea, constipation, flank pain, dysuria, hematuria, urinary  Frequency, nocturia, numbness, tingling, seizures,  Focal weakness, Loss of consciousness,  Tremor, insomnia, depression, anxiety, and suicidal ideation.      Objective:  BP 104/72 (BP Location: Left Arm, Patient Position: Sitting, Cuff Size: Normal)   Pulse 68   Temp 98 F (36.7 C) (Temporal)   Resp 14   Ht 5\' 5"  (1.651 m)   Wt 134 lb 3.2 oz (60.9 kg)   SpO2 96%   BMI 22.33 kg/m   Physical Exam  General appearance: alert, cooperative and appears stated age Head: Normocephalic, without obvious abnormality, atraumatic Eyes: conjunctivae/corneas clear. PERRL, EOM's intact. Fundi benign. Ears: normal TM's and external ear canals both ears Nose: Nares normal. Septum midline. Mucosa normal. No drainage or sinus tenderness. Throat: lips, mucosa, and tongue normal; teeth and gums normal Neck: no adenopathy, no carotid bruit, no JVD, supple, symmetrical, trachea midline and thyroid not enlarged, symmetric, no tenderness/mass/nodules Lungs: clear to auscultation bilaterally Breasts: left breast without scars, masses,  normal appearance, right breast without  masses  Heart: regular rate and rhythm, S1, S2 normal, no murmur, click, rub or gallop Abdomen: soft, non-tender; bowel sounds normal; no masses,  no organomegaly Extremities: extremities normal, atraumatic, no cyanosis or edema Pulses: 2+ and  symmetric Skin: Skin color, texture, turgor normal. No rashes or lesions Neurologic: Alert and oriented X 3, normal strength and tone. Normal symmetric reflexes. Normal coordination and gait.    Assessment & Plan:   Problem List Items Addressed This Visit      Unprioritized   History of breast cancer    No recurrence since diagnosis and lumpectmy and treatment.  Continue annual mammogram and CA 27.29       Relevant Orders   Cancer antigen 27.29   Encounter for preventive health examination    age appropriate education and counseling updated, referrals for preventative services and immunizations addressed, dietary and smoking counseling addressed, most recent labs reviewed.  I have personally reviewed and have noted:  1) the patient's medical and social history 2) The pt's use of alcohol, tobacco, and illicit drugs 3) The patient's current medications and supplements 4) Functional ability including ADL's, fall risk, home safety risk, hearing and visual impairment 5) Diet and physical activities 6) Evidence for depression or mood disorder 7) The patient's height, weight, and BMI have been recorded in the chart  I have made referrals, and provided counseling and education based on review of the above      Relevant Orders   Comprehensive metabolic panel (Completed)   Lipid panel (Completed)   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 Thaden maintain her  cholecalciferol, ALPRAZolam, naproxen sodium, and Misc Natural Products ([place] PO).  No orders of the defined types were placed in this encounter.   There are no discontinued medications.  Follow-up: No follow-ups on file.   Crecencio Mc, MD

## 2019-11-20 LAB — CANCER ANTIGEN 27.29: CA 27.29: 12 U/mL (ref <38)

## 2019-11-25 ENCOUNTER — Other Ambulatory Visit: Payer: Self-pay | Admitting: Internal Medicine

## 2020-01-28 ENCOUNTER — Ambulatory Visit
Admission: RE | Admit: 2020-01-28 | Discharge: 2020-01-28 | Disposition: A | Discharge status: Home / Self Care | Payer: BC Managed Care – PPO | Source: Ambulatory Visit | Attending: Internal Medicine | Admitting: Internal Medicine

## 2020-01-28 ENCOUNTER — Other Ambulatory Visit: Payer: Self-pay

## 2020-01-28 DIAGNOSIS — Z780 Asymptomatic menopausal state: Secondary | ICD-10-CM | POA: Insufficient documentation

## 2020-01-28 DIAGNOSIS — Z1231 Encounter for screening mammogram for malignant neoplasm of breast: Secondary | ICD-10-CM

## 2020-03-16 ENCOUNTER — Encounter: Payer: BC Managed Care – PPO | Admitting: Dermatology

## 2020-04-20 ENCOUNTER — Ambulatory Visit: Payer: BC Managed Care – PPO | Admitting: Dermatology

## 2020-04-20 ENCOUNTER — Other Ambulatory Visit: Payer: Self-pay

## 2020-04-20 DIAGNOSIS — L570 Actinic keratosis: Secondary | ICD-10-CM | POA: Diagnosis not present

## 2020-04-20 DIAGNOSIS — L821 Other seborrheic keratosis: Secondary | ICD-10-CM | POA: Diagnosis not present

## 2020-04-20 DIAGNOSIS — D239 Other benign neoplasm of skin, unspecified: Secondary | ICD-10-CM

## 2020-04-20 DIAGNOSIS — D2371 Other benign neoplasm of skin of right lower limb, including hip: Secondary | ICD-10-CM | POA: Diagnosis not present

## 2020-04-20 DIAGNOSIS — L820 Inflamed seborrheic keratosis: Secondary | ICD-10-CM | POA: Diagnosis not present

## 2020-04-20 DIAGNOSIS — L814 Other melanin hyperpigmentation: Secondary | ICD-10-CM

## 2020-04-20 DIAGNOSIS — D229 Melanocytic nevi, unspecified: Secondary | ICD-10-CM

## 2020-04-20 DIAGNOSIS — D1800 Hemangioma unspecified site: Secondary | ICD-10-CM

## 2020-04-20 DIAGNOSIS — L578 Other skin changes due to chronic exposure to nonionizing radiation: Secondary | ICD-10-CM

## 2020-04-20 DIAGNOSIS — Z1283 Encounter for screening for malignant neoplasm of skin: Secondary | ICD-10-CM

## 2020-04-20 NOTE — Progress Notes (Signed)
Follow-Up Visit   Subjective  Karen Washington is a 64 y.o. female who presents for the following: Annual Exam (History of AKs - TBSE today). The patient presents for Total-Body Skin Exam (TBSE) for skin cancer screening and mole check.  The following portions of the chart were reviewed this encounter and updated as appropriate:   Tobacco  Allergies  Meds  Problems  Med Hx  Surg Hx  Fam Hx     Review of Systems:  No other skin or systemic complaints except as noted in HPI or Assessment and Plan.  Objective  Well appearing patient in no apparent distress; mood and affect are within normal limits.  A full examination was performed including scalp, head, eyes, ears, nose, lips, neck, chest, axillae, abdomen, back, buttocks, bilateral upper extremities, bilateral lower extremities, hands, feet, fingers, toes, fingernails, and toenails. All findings within normal limits unless otherwise noted below.  Objective  Left distal forearm, left upper eyelid below brow (2): Erythematous keratotic or waxy stuck-on papule or plaque.   Flesh colored papule of left upper eyelid below brow lat to hemangioma.  Objective  Right chest: Erythematous thin papules/macules with gritty scale.   Objective  Right pretibial: Firm pink/brown papulenodule with dimple sign.    Assessment & Plan    Lentigines - Scattered tan macules - Discussed due to sun exposure - Benign, observe - Call for any changes  Seborrheic Keratoses - Stuck-on, waxy, tan-brown papules and plaques  - Discussed benign etiology and prognosis. - Observe - Call for any changes  Melanocytic Nevi - Tan-brown and/or pink-flesh-colored symmetric macules and papules - Benign appearing on exam today - Observation - Call clinic for new or changing moles - Recommend daily use of broad spectrum spf 30+ sunscreen to sun-exposed areas.   Hemangiomas - Red papules - Discussed benign nature - Observe - Call for any  changes  Actinic Damage - Chronic, secondary to cumulative UV/sun exposure - diffuse scaly erythematous macules with underlying dyspigmentation - Recommend daily broad spectrum sunscreen SPF 30+ to sun-exposed areas, reapply every 2 hours as needed.  - Call for new or changing lesions.  Skin cancer screening performed today.  Inflamed seborrheic keratosis (2) Left distal forearm, left upper eyelid below brow RTC if not resolved after 2 months and will plan biopsy to left distal volar foream.  ISK with adjacent Milia - left upper eyelid below brow  Destruction of lesion - Left distal forearm, left upper eyelid below brow Complexity: simple   Destruction method: cryotherapy   Informed consent: discussed and consent obtained   Timeout:  patient name, date of birth, surgical site, and procedure verified Lesion destroyed using liquid nitrogen: Yes   Region frozen until ice ball extended beyond lesion: Yes   Outcome: patient tolerated procedure well with no complications   Post-procedure details: wound care instructions given    AK (actinic keratosis) Right chest  Destruction of lesion - Right chest Complexity: simple   Destruction method: cryotherapy   Informed consent: discussed and consent obtained   Timeout:  patient name, date of birth, surgical site, and procedure verified Lesion destroyed using liquid nitrogen: Yes   Region frozen until ice ball extended beyond lesion: Yes   Outcome: patient tolerated procedure well with no complications   Post-procedure details: wound care instructions given    Dermatofibroma Right pretibial Benign, observe.   Return in about 1 year (around 04/20/2021) for TBSE.  I, Ashok Cordia, CMA, am acting as scribe for Sarina Ser,  MD .  Documentation: I have reviewed the above documentation for accuracy and completeness, and I agree with the above.  Sarina Ser, MD

## 2020-04-20 NOTE — Patient Instructions (Signed)

## 2020-04-24 ENCOUNTER — Encounter: Payer: Self-pay | Admitting: Dermatology

## 2020-11-24 ENCOUNTER — Ambulatory Visit (INDEPENDENT_AMBULATORY_CARE_PROVIDER_SITE_OTHER): Payer: BC Managed Care – PPO | Admitting: Internal Medicine

## 2020-11-24 ENCOUNTER — Other Ambulatory Visit (HOSPITAL_COMMUNITY)
Admission: RE | Admit: 2020-11-24 | Discharge: 2020-11-24 | Disposition: A | Discharge status: Home / Self Care | Payer: BC Managed Care – PPO | Source: Ambulatory Visit | Attending: Internal Medicine | Admitting: Internal Medicine

## 2020-11-24 ENCOUNTER — Other Ambulatory Visit: Payer: Self-pay

## 2020-11-24 ENCOUNTER — Encounter: Payer: Self-pay | Admitting: Internal Medicine

## 2020-11-24 VITALS — BP 96/60 | HR 66 | Temp 96.7°F | Resp 14 | Ht 65.0 in | Wt 134.2 lb

## 2020-11-24 DIAGNOSIS — Z124 Encounter for screening for malignant neoplasm of cervix: Secondary | ICD-10-CM | POA: Diagnosis not present

## 2020-11-24 DIAGNOSIS — I721 Aneurysm of artery of upper extremity: Secondary | ICD-10-CM

## 2020-11-24 DIAGNOSIS — Z0000 Encounter for general adult medical examination without abnormal findings: Secondary | ICD-10-CM | POA: Diagnosis not present

## 2020-11-24 DIAGNOSIS — E782 Mixed hyperlipidemia: Secondary | ICD-10-CM | POA: Diagnosis not present

## 2020-11-24 DIAGNOSIS — Z8616 Personal history of COVID-19: Secondary | ICD-10-CM

## 2020-11-24 DIAGNOSIS — Z1231 Encounter for screening mammogram for malignant neoplasm of breast: Secondary | ICD-10-CM | POA: Diagnosis not present

## 2020-11-24 DIAGNOSIS — Z853 Personal history of malignant neoplasm of breast: Secondary | ICD-10-CM

## 2020-11-24 NOTE — Progress Notes (Signed)
Patient ID: Karen Washington, female    DOB: [birth year]  Age: 65 y.o. MRN: [id number]  The patient is here for annual preventive examination and management of other chronic problems.   The risk factors are reflected in the social history.  The roster of all physicians providing medical care to patient - is listed in the Snapshot section of the chart.  Activities of daily living:  The patient is 100% independent in all ADLs: dressing, toileting, feeding as well as independent mobility  Home safety : The patient has smoke detectors in the home. They wear seatbelts.  There are no  unsecured firearms at home. There is no violence in the home.   There is no risks for hepatitis, STDs or HIV. There is no   history of blood transfusion. They have no travel history to infectious disease endemic areas of the world.  The patient has seen their dentist in the last six month. They have seen their eye doctor in the last year. She denies  hearing difficulty with regard to whispered voices and some television programs.  They have deferred audiologic testing in the last year.  They do not  have excessive sun exposure. Discussed the need for sun protection: hats, long sleeves and use of sunscreen if there is significant sun exposure.   Diet: the importance of a healthy diet is discussed. They do have a healthy diet.  The benefits of regular aerobic exercise were discussed. She walks 4 times per week ,  20 minutes.   Depression screen: there are no signs or vegative symptoms of depression- irritability, change in appetite, anhedonia, sadness/tearfullness.  Cognitive assessment: the patient manages all their financial and personal affairs and is actively engaged. They could relate day,date,year and events; recalled 2/3 objects at 3 minutes; performed clock-face test normally.  The following portions of the patient's history were reviewed and updated as appropriate: allergies, current medications, past family history,  past medical history,  past surgical history, past social history  and problem list.  Visual acuity was not assessed per patient preference since she has regular follow up with her ophthalmologist. Hearing and body mass index were assessed and reviewed.   During the course of the visit the patient was educated and counseled about appropriate screening and preventive services including : fall prevention , diabetes screening, nutrition counseling, colorectal cancer screening, and recommended immunizations.    CC: The primary encounter diagnosis was Encounter for screening mammogram for malignant neoplasm of breast. 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   1) history of sciatica;  resolved,  but now having bilateral tightness in th SI joint area with occasional spasm.  Stretches,  gets regular massage , does yoga.   History Karen Washington has a past medical history of Breast cancer ([place]), Breast cancer of upper-inner quadrant of right female breast ([place]) (11/2012), Personal history of chemotherapy, Personal history of radiation therapy, and Shingles (2014).   She has a past surgical history that includes Breast surgery (Right, December 13 2012); Portacath placement (2014); Mastopexy (Left, Nov 2015); Port-a-cath removal (02-05-14); Colonoscopy (2011); Breast biopsy (Right, 11/22/2012); Breast lumpectomy (Right, 12/13/2012); and Augmentation mammaplasty (Left, 2014).   Her family history includes Breast cancer in her maternal grandmother; Cancer in her father and mother.She reports that she has never smoked. She has never used  smokeless tobacco. She reports current alcohol use. She reports that she does not use drugs.  Outpatient Medications Prior to Visit  Medication Sig Dispense  Refill   ALPRAZolam (XANAX) 0.25 MG tablet TAKE ONE TABLET BY MOUTH AT BEDTIME AS NEEDED FOR SLEEP 30 tablet 5   cholecalciferol (VITAMIN D) 1000 units tablet Take 1,000 Units by mouth daily.     Melatonin 10 MG TABS Take 1 tablet by mouth at bedtime as needed.     Misc Natural Products (COSAMIN ASU FOR JOINT HEALTH PO) Take 2 capsules by mouth daily. Pt stated Mon, Wed, Fri she takes 2 capsules and all other days she takes one.     naproxen sodium (ALEVE) 220 MG tablet Take 220 mg by mouth daily as needed.     No facility-administered medications prior to visit.    Review of Systems  Patient denies headache, fevers, malaise, unintentional weight loss, skin rash, eye pain, sinus congestion and sinus pain, sore throat, dysphagia,  hemoptysis , cough, dyspnea, wheezing, chest pain, palpitations, orthopnea, edema, abdominal pain, nausea, melena, diarrhea, constipation, flank pain, dysuria, hematuria, urinary  Frequency, nocturia, numbness, tingling, seizures,  Focal weakness, Loss of consciousness,  Tremor, insomnia, depression, anxiety, and suicidal ideation.     Objective:  BP 96/60 (BP Location: Left Arm, Patient Position: Sitting, Cuff Size: Normal)   Pulse 66   Temp (!) 96.7 F (35.9 C) (Temporal)   Resp 14   Ht 5\' 5"  (1.651 m)   Wt 134 lb 3.2 oz (60.9 kg)   SpO2 95%   BMI 22.33 kg/m   Physical Exam  General Appearance:    Alert, cooperative, no distress, appears stated age  Head:    Normocephalic, without obvious abnormality, atraumatic  Eyes:    PERRL, conjunctiva/corneas clear, EOM's intact, fundi    benign, both eyes  Ears:    Normal TM's and external ear canals, both ears  Nose:   Nares normal, septum midline, mucosa normal, no drainage    or sinus tenderness  Throat:   Lips, mucosa, and tongue normal; teeth and gums normal  Neck:   Supple, symmetrical, trachea midline, no adenopathy;    thyroid:  no enlargement/tenderness/nodules; no carotid   bruit or JVD  Back:      Symmetric, no curvature, ROM normal, no CVA tenderness  Lungs:     Clear to auscultation bilaterally, respirations unlabored  Chest Wall:    No tenderness or deformity   Heart:    Regular rate and rhythm, S1 and S2 normal, no murmur, rub   or gallop  Breast Exam:    right breast surgical scars o tenderness, masses, or nipple abnormality  Abdomen:     Soft, non-tender, bowel sounds active all four quadrants,    no masses, no organomegaly  Genitalia:    Pelvic: cervix normal in appearance, external genitalia normal, no adnexal masses or tenderness, no cervical motion tenderness, rectovaginal septum normal, uterus normal size, shape, and consistency and vagina normal without discharge  Extremities:   Extremities normal, atraumatic, no cyanosis or edema  Pulses:   2+ and symmetric all extremities  Skin:   Skin color, texture, turgor normal, no rashes or lesions  Lymph nodes:   Cervical, supraclavicular, and axillary nodes normal  Neurologic:   CNII-XII intact, normal strength, sensation and reflexes    throughout     Assessment & Plan:   Problem List Items Addressed This Visit       Unprioritized   Aneurysm of artery  of upper extremity ([place])    Given the patient's asymptomatic subcritical aneurysm no further invasive testing or surgery at this time.  Duplex ultrasound shows the aneurysm is small and does not appear to have grown by Nov 2020  assessment .  Continue antiplatelet therapy as prescribed        Encounter for preventive health examination    age appropriate education and counseling updated, referrals for preventative services and immunizations addressed, dietary and smoking counseling addressed, most recent labs reviewed.  I have personally reviewed and have noted:   1) the patient's medical and social history 2) The pt's use of alcohol, tobacco, and illicit drugs 3) The patient's current medications and supplements 4) Functional ability including ADL's, fall risk, home  safety risk, hearing and visual impairment 5) Diet and physical activities 6) Evidence for depression or mood disorder 7) The patient's height, weight, and BMI have been recorded in the chart   I have made referrals, and provided counseling and education based on review of the above       History of breast cancer    No recurrence since diagnosis in 2014.  Annual screening mammogram ordered        Other Visit Diagnoses     Encounter for screening mammogram for malignant neoplasm of breast    -  Primary   Relevant Orders   MM 3D SCREEN BREAST BILATERAL   Cervical cancer screening       Relevant Orders   Cytology - PAP   Moderate mixed hyperlipidemia not requiring statin therapy       Relevant Orders   Lipid panel   TSH   Comprehensive metabolic panel   History of COVID-19       Relevant Orders   SARS-CoV-2 Semi-Quantitative Total Antibody, Spike       I am having Markan C. Younes maintain her cholecalciferol, naproxen sodium, Misc Natural Products (COSAMIN ASU FOR JOINT HEALTH PO), ALPRAZolam, and Melatonin.  No orders of the defined types were placed in this encounter.   There are no discontinued medications.  Follow-up: No follow-ups on file.   Crecencio Mc, MD

## 2020-11-24 NOTE — Assessment & Plan Note (Signed)
No recurrence since diagnosis in 2014.  Annual screening mammogram ordered

## 2020-11-24 NOTE — Assessment & Plan Note (Signed)
Given the patient's asymptomatic subcriticalaneurysmno further invasive testing or surgery at this time. Duplex ultrasound showsthe aneurysm is small and does not appear to have grown by Nov 2020  assessment . Continue antiplatelet therapy as prescribed

## 2020-11-24 NOTE — Assessment & Plan Note (Signed)

## 2020-11-24 NOTE — Patient Instructions (Signed)
FOR YOUR TROUBLE MAINTAINING SLEEP:  I recommend trying melatonin  every evening after dinner INSTEAD OF AT BEDTIME  TRAZODONE CAN BE PRESCRIBED IF NEEDED  Your annual mammogram has been ordered.  You are encouraged (required) to call to make your appointment at [place] [phone]

## 2020-11-25 LAB — COMPREHENSIVE METABOLIC PANEL
ALT: 13 U/L (ref 0–35)
AST: 17 U/L (ref 0–37)
Albumin: 4.3 g/dL (ref 3.5–5.2)
Alkaline Phosphatase: 54 U/L (ref 39–117)
BUN: 17 mg/dL (ref 6–23)
CO2: 30 mEq/L (ref 19–32)
Calcium: 9.2 mg/dL (ref 8.4–10.5)
Chloride: 102 mEq/L (ref 96–112)
Creatinine, Ser: 0.98 mg/dL (ref 0.40–1.20)
GFR: 61.59 mL/min (ref >60.00)
Glucose, Bld: 85 mg/dL (ref 70–99)
Potassium: 3.8 mEq/L (ref 3.5–5.1)
Sodium: 140 mEq/L (ref 135–145)
Total Bilirubin: 0.5 mg/dL (ref 0.2–1.2)
Total Protein: 6.4 g/dL (ref 6.0–8.3)

## 2020-11-25 LAB — TSH: TSH: 1.13 u[IU]/mL (ref 0.35–5.50)

## 2020-11-25 LAB — LIPID PANEL
Cholesterol: 201 mg/dL — ABNORMAL HIGH (ref 0–200)
HDL: 87.4 mg/dL (ref >39.00)
LDL Cholesterol: 98 mg/dL (ref 0–99)
NonHDL: 113.44
Total CHOL/HDL Ratio: 2
Triglycerides: 77 mg/dL (ref 0.0–149.0)
VLDL: 15.4 mg/dL (ref 0.0–40.0)

## 2020-11-26 LAB — SARS-COV-2 SEMI-QUANTITATIVE TOTAL ANTIBODY, SPIKE: SARS COV2 AB, Total Spike Semi QN: >2500 U/mL — ABNORMAL HIGH (ref <0.8)

## 2020-11-30 LAB — CYTOLOGY - PAP
Comment: NEGATIVE
Diagnosis: NEGATIVE
High risk HPV: NEGATIVE

## 2020-12-14 ENCOUNTER — Telehealth: Payer: BC Managed Care – PPO | Admitting: Internal Medicine

## 2020-12-14 ENCOUNTER — Encounter: Payer: Self-pay | Admitting: Internal Medicine

## 2020-12-14 VITALS — Ht 65.0 in | Wt 135.0 lb

## 2020-12-14 DIAGNOSIS — Z20822 Contact with and (suspected) exposure to covid-19: Secondary | ICD-10-CM | POA: Diagnosis not present

## 2020-12-14 NOTE — Progress Notes (Signed)
Virtual Visit via [place]  Note  This visit type was conducted due to national recommendations for restrictions regarding the COVID-19 pandemic (e.g. social distancing).  This format is felt to be most appropriate for this patient at this time.  All issues noted in this document were discussed and addressed.  No physical exam was performed (except for noted visual exam findings with Video Visits).   I connected withNAME@ on 12/14/20 at  3:00 PM EDT by a video enabled telemedicine application  and verified that I am speaking with the correct person using two identifiers. Location patient: home Location provider: work or home office Persons participating in the virtual visit: patient, provider  I discussed the limitations, risks, security and privacy concerns of performing an evaluation and management service by telephone and the availability of in person appointments. I also discussed with the patient that there may be a patient responsible charge related to this service. The patient expressed understanding and agreed to proceed. Reason for visit: infectious symptoms x 1 week     HPI:  65 yr old female, COVID  VACCINATED,  presents with multiple signs and symptoms of COVID infection which have been present for the past week.  Anorexia, altered taste, Body aches,  headache , sinus  congestion with clear drainage.  Loose stool today after eating yogurt .     ROS: See pertinent positives and negatives per HPI.  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 Whole breast radiation.   Adjuvant chemotherapy with Docetaxel/Carboplatin/Herceptin starting on 01/15/13, completed 6 cycles of Docetaxel/Carboplatin on 04/29/13.  Completed 52 weeks of Herceptin on 01/06/14.   Personal history of chemotherapy    Personal history of  radiation therapy    Shingles 2014    Past Surgical History:  Procedure Laterality Date   AUGMENTATION MAMMAPLASTY Left 2014   no implant, mastopexy done   BREAST BIOPSY Right 11/22/2012   INVASIVE MAMMARY CARCINOMA, NO SPECIAL TYPE, DCIS   BREAST LUMPECTOMY Right 12/13/2012   invasive mammary carcinoma and DCIS. Clear margins   BREAST SURGERY Right December 13 2012   lumpectomy   COLONOSCOPY  2011   MASTOPEXY Left Nov 2015   Dr Tula Nakayama   [facility] REMOVAL  02-05-14   Dr Bary Castilla   PORTACATH PLACEMENT  2014    Family History  Problem Relation Age of Onset   Cancer Mother        uterine    Breast cancer Maternal Grandmother    Cancer Father        prostate    SOCIAL HX:  reports that she has never smoked. She has never used smokeless tobacco. She reports current alcohol use. She reports that she does not use drugs.    Current Outpatient Medications:    acetaminophen (TYLENOL) 500 MG tablet, Take 500 mg by mouth every 6 (six) hours as needed., Disp: , Rfl:    ALPRAZolam (XANAX) 0.25 MG tablet, TAKE ONE TABLET BY MOUTH AT BEDTIME AS NEEDED FOR SLEEP, Disp: 30 tablet, Rfl: 5   cholecalciferol (VITAMIN D) 1000 units tablet, Take 1,000 Units by mouth daily., Disp: , Rfl:    Melatonin 10 MG TABS, Take 1 tablet by mouth at bedtime as needed., Disp: , Rfl:    Misc Natural Products ([place] PO), Take 2  capsules by mouth daily. Pt stated Mon, Wed, Fri she takes 2 capsules and all other days she takes one., Disp: , Rfl:    naproxen sodium (ALEVE) 220 MG tablet, Take 220 mg by mouth daily as needed., Disp: , Rfl:   EXAM:  VITALS per patient if applicable:  GENERAL: alert, oriented, appears well and in no acute distress  HEENT: atraumatic, conjunttiva clear, no obvious abnormalities on inspection of external nose and ears  NECK: normal movements of the head and neck  LUNGS: on inspection no signs of respiratory distress, breathing rate appears normal, no obvious  gross SOB, gasping or wheezing  CV: no obvious cyanosis  MS: moves all visible extremities without noticeable abnormality  PSYCH/NEURO: pleasant and cooperative, no obvious depression or anxiety, speech and thought processing grossly intact  ASSESSMENT AND PLAN:  Discussed the following assessment and plan:  Suspected COVID-19 virus infection - Plan: COVID-19, Flu A+B and RSV  Suspected COVID-19 virus infection Advised to have pCR testing as well as testing for influenza .  Continue quarantine until PCR is negative     I discussed the assessment and treatment plan with the patient. The patient was provided an opportunity to ask questions and all were answered. The patient agreed with the plan and demonstrated an understanding of the instructions.   The patient was advised to call back or seek an in-person evaluation if the symptoms worsen or if the condition fails to improve as anticipated.   I spent 30 minutes dedicated to the care of this patient on the date of this encounter to include pre-visit review of his medical history,  Face-to-face time with the patient , and post visit ordering of testing and therapeutics.    Crecencio Mc, MD

## 2020-12-14 NOTE — Telephone Encounter (Signed)
Spoke with pt and scheduled her for a virtual appt this afternoon at 3pm.

## 2020-12-15 ENCOUNTER — Other Ambulatory Visit: Payer: BC Managed Care – PPO

## 2020-12-15 DIAGNOSIS — Z20822 Contact with and (suspected) exposure to covid-19: Secondary | ICD-10-CM

## 2020-12-15 HISTORY — DX: Contact with and (suspected) exposure to covid-19: Z20.822

## 2020-12-15 NOTE — Assessment & Plan Note (Signed)
Advised to have pCR testing as well as testing for influenza .  Continue quarantine until PCR is negative

## 2020-12-17 LAB — COVID-19, FLU A+B AND RSV
Influenza A, NAA: NOT DETECTED
Influenza B, NAA: NOT DETECTED
RSV, NAA: NOT DETECTED
SARS-CoV-2, NAA: DETECTED — AB

## 2021-01-20 ENCOUNTER — Telehealth: Payer: BC Managed Care – PPO | Admitting: Internal Medicine

## 2021-02-02 ENCOUNTER — Ambulatory Visit
Admission: RE | Admit: 2021-02-02 | Discharge: 2021-02-02 | Disposition: A | Discharge status: Home / Self Care | Payer: BC Managed Care – PPO | Source: Ambulatory Visit | Attending: Internal Medicine | Admitting: Internal Medicine

## 2021-02-02 ENCOUNTER — Other Ambulatory Visit: Payer: Self-pay

## 2021-02-02 DIAGNOSIS — Z1231 Encounter for screening mammogram for malignant neoplasm of breast: Secondary | ICD-10-CM | POA: Diagnosis not present

## 2021-02-08 ENCOUNTER — Other Ambulatory Visit: Payer: Self-pay | Admitting: Internal Medicine

## 2021-02-08 DIAGNOSIS — R921 Mammographic calcification found on diagnostic imaging of breast: Secondary | ICD-10-CM

## 2021-02-08 DIAGNOSIS — N6320 Unspecified lump in the left breast, unspecified quadrant: Secondary | ICD-10-CM

## 2021-02-08 DIAGNOSIS — R928 Other abnormal and inconclusive findings on diagnostic imaging of breast: Secondary | ICD-10-CM

## 2021-02-12 ENCOUNTER — Other Ambulatory Visit: Payer: BC Managed Care – PPO

## 2021-02-12 ENCOUNTER — Ambulatory Visit
Admission: RE | Admit: 2021-02-12 | Discharge: 2021-02-12 | Disposition: A | Discharge status: Home / Self Care | Payer: BC Managed Care – PPO | Source: Ambulatory Visit | Attending: Internal Medicine | Admitting: Internal Medicine

## 2021-02-12 ENCOUNTER — Ambulatory Visit: Admission: RE | Admit: 2021-02-12 | Payer: BC Managed Care – PPO | Source: Ambulatory Visit

## 2021-02-12 ENCOUNTER — Other Ambulatory Visit: Payer: Self-pay

## 2021-02-12 DIAGNOSIS — R928 Other abnormal and inconclusive findings on diagnostic imaging of breast: Secondary | ICD-10-CM | POA: Diagnosis not present

## 2021-02-12 DIAGNOSIS — N6320 Unspecified lump in the left breast, unspecified quadrant: Secondary | ICD-10-CM

## 2021-02-12 DIAGNOSIS — R921 Mammographic calcification found on diagnostic imaging of breast: Secondary | ICD-10-CM | POA: Insufficient documentation

## 2021-02-14 DIAGNOSIS — R921 Mammographic calcification found on diagnostic imaging of breast: Secondary | ICD-10-CM | POA: Insufficient documentation

## 2021-02-14 NOTE — Assessment & Plan Note (Signed)
Right breast.  Biopsy advised.

## 2021-02-15 ENCOUNTER — Other Ambulatory Visit: Payer: Self-pay | Admitting: Internal Medicine

## 2021-02-15 ENCOUNTER — Telehealth: Payer: Self-pay

## 2021-02-15 DIAGNOSIS — R921 Mammographic calcification found on diagnostic imaging of breast: Secondary | ICD-10-CM

## 2021-02-15 DIAGNOSIS — R928 Other abnormal and inconclusive findings on diagnostic imaging of breast: Secondary | ICD-10-CM

## 2021-02-15 NOTE — Telephone Encounter (Signed)
LMTCB in regards to results.  

## 2021-02-17 ENCOUNTER — Ambulatory Visit
Admission: RE | Admit: 2021-02-17 | Discharge: 2021-02-17 | Disposition: A | Discharge status: Home / Self Care | Payer: BC Managed Care – PPO | Source: Ambulatory Visit | Attending: Internal Medicine | Admitting: Internal Medicine

## 2021-02-17 ENCOUNTER — Other Ambulatory Visit: Payer: Self-pay

## 2021-02-17 DIAGNOSIS — R928 Other abnormal and inconclusive findings on diagnostic imaging of breast: Secondary | ICD-10-CM

## 2021-02-17 DIAGNOSIS — R921 Mammographic calcification found on diagnostic imaging of breast: Secondary | ICD-10-CM | POA: Diagnosis present

## 2021-02-17 HISTORY — PX: BREAST BIOPSY: SHX20

## 2021-02-18 ENCOUNTER — Ambulatory Visit: Payer: BC Managed Care – PPO

## 2021-02-18 ENCOUNTER — Other Ambulatory Visit: Payer: BC Managed Care – PPO

## 2021-02-18 LAB — SURGICAL PATHOLOGY

## 2021-02-20 NOTE — Assessment & Plan Note (Signed)
Benign biopsy of right breast Oct 2022  Return to annual screening Oct 2023

## 2021-04-19 ENCOUNTER — Other Ambulatory Visit: Payer: Self-pay

## 2021-04-19 ENCOUNTER — Ambulatory Visit: Payer: BC Managed Care – PPO | Admitting: Dermatology

## 2021-04-19 DIAGNOSIS — L578 Other skin changes due to chronic exposure to nonionizing radiation: Secondary | ICD-10-CM

## 2021-04-19 DIAGNOSIS — L814 Other melanin hyperpigmentation: Secondary | ICD-10-CM

## 2021-04-19 DIAGNOSIS — D229 Melanocytic nevi, unspecified: Secondary | ICD-10-CM | POA: Diagnosis not present

## 2021-04-19 DIAGNOSIS — Z1283 Encounter for screening for malignant neoplasm of skin: Secondary | ICD-10-CM | POA: Diagnosis not present

## 2021-04-19 DIAGNOSIS — L570 Actinic keratosis: Secondary | ICD-10-CM

## 2021-04-19 DIAGNOSIS — D1800 Hemangioma unspecified site: Secondary | ICD-10-CM

## 2021-04-19 DIAGNOSIS — L821 Other seborrheic keratosis: Secondary | ICD-10-CM

## 2021-04-19 MED ORDER — FLUOROURACIL 5 % EX CREA: TOPICAL_CREAM | Freq: Two times a day (BID) | CUTANEOUS | 1 refills | Status: DC

## 2021-04-19 NOTE — Patient Instructions (Addendum)
Start Cream in January apply twice daily for 7 days to forehead and temples.    5-Fluorouracil/Calcipotriene Patient Education   Actinic keratoses are the dry, red scaly spots on the skin caused by sun damage. A portion of these spots can turn into skin cancer with time, and treating them can help prevent development of skin cancer.   Treatment of these spots requires removal of the defective skin cells. There are various ways to remove actinic keratoses, including freezing with liquid nitrogen, treatment with creams, or treatment with a blue light procedure in the office.   5-fluorouracil cream is a topical cream used to treat actinic keratoses. It works by interfering with the growth of abnormal fast-growing skin cells, such as actinic keratoses. These cells peel off and are replaced by healthy ones.   5-fluorouracil/calcipotriene is a combination of the 5-fluorouracil cream with a vitamin D analog cream called calcipotriene. The calcipotriene alone does not treat actinic keratoses. However, when it is combined with 5-fluorouracil, it helps the 5-fluorouracil treat the actinic keratoses much faster so that the same results can be achieved with a much shorter treatment time.  INSTRUCTIONS FOR 5-FLUOROURACIL/CALCIPOTRIENE CREAM:   5-fluorouracil/calcipotriene cream typically only needs to be used for 4-7 days. A thin layer should be applied twice a day to the treatment areas recommended by your physician.   If your physician prescribed you separate tubes of 5-fluourouracil and calcipotriene, apply a thin layer of 5-fluorouracil followed by a thin layer of calcipotriene.   Avoid contact with your eyes, nostrils, and mouth. Do not use 5-fluorouracil/calcipotriene cream on infected or open wounds.   You will develop redness, irritation and some crusting at areas where you have pre-cancer damage/actinic keratoses. IF YOU DEVELOP PAIN, BLEEDING, OR SIGNIFICANT CRUSTING, STOP THE TREATMENT EARLY - you  have already gotten a good response and the actinic keratoses should clear up well.  Wash your hands after applying 5-fluorouracil 5% cream on your skin.   A moisturizer or sunscreen with a minimum SPF 30 should be applied each morning.   Once you have finished the treatment, you can apply a thin layer of Vaseline twice a day to irritated areas to soothe and calm the areas more quickly. If you experience significant discomfort, contact your physician.  For some patients it is necessary to repeat the treatment for best results.  SIDE EFFECTS: When using 5-fluorouracil/calcipotriene cream, you may have mild irritation, such as redness, dryness, swelling, or a mild burning sensation. This usually resolves within 2 weeks. The more actinic keratoses you have, the more redness and inflammation you can expect during treatment. Eye irritation has been reported rarely. If this occurs, please let us know.  If you have any trouble using this cream, please call the office. If you have any other questions about this information, please do not hesitate to ask me before you leave the office.           Actinic keratoses are precancerous spots that appear secondary to cumulative UV radiation exposure/sun exposure over time. They are chronic with expected duration over 1 year. A portion of actinic keratoses will progress to squamous cell carcinoma of the skin. It is not possible to reliably predict which spots will progress to skin cancer and so treatment is recommended to prevent development of skin cancer.  Recommend daily broad spectrum sunscreen SPF 30+ to sun-exposed areas, reapply every 2 hours as needed.  Recommend staying in the shade or wearing long sleeves, sun glasses (UVA+UVB protection) and wide  brim hats (4-inch brim around the entire circumference of the hat). Call for new or changing lesions.   Melanoma ABCDEs  Melanoma is the most dangerous type of skin cancer, and is the leading cause  of death from skin disease.  You are more likely to develop melanoma if you: Have light-colored skin, light-colored eyes, or red or blond hair Spend a lot of time in the sun Tan regularly, either outdoors or in a tanning bed Have had blistering sunburns, especially during childhood Have a close family member who has had a melanoma Have atypical moles or large birthmarks  Early detection of melanoma is key since treatment is typically straightforward and cure rates are extremely high if we catch it early.   The first sign of melanoma is often a change in a mole or a new dark spot.  The ABCDE system is a way of remembering the signs of melanoma.  A for asymmetry:  The two halves do not match. B for border:  The edges of the growth are irregular. C for color:  A mixture of colors are present instead of an even brown color. D for diameter:  Melanomas are usually (but not always) greater than 4mm - the size of a pencil eraser. E for evolution:  The spot keeps changing in size, shape, and color.  Please check your skin once per month between visits. You can use a small mirror in front and a large mirror behind you to keep an eye on the back side or your body.   If you see any new or changing lesions before your next follow-up, please call to schedule a visit.  Please continue daily skin protection including broad spectrum sunscreen SPF 30+ to sun-exposed areas, reapplying every 2 hours as needed when you're outdoors.   Staying in the shade or wearing long sleeves, sun glasses (UVA+UVB protection) and wide brim hats (4-inch brim around the entire circumference of the hat) are also recommended for sun protection.       If You Need Anything After Your Visit  If you have any questions or concerns for your doctor, please call our main line at [phone] and press option 4 to reach your doctor's medical assistant. If no one answers, please leave a voicemail as directed and we will return your  call as soon as possible. Messages left after 4 pm will be answered the following business day.   You may also send [place] a message via [place]. We typically respond to MyChart messages within 1-2 business days.  For prescription refills, please ask your pharmacy to contact our office. Our fax number is [phone].  If you have an urgent issue when the clinic is closed that cannot wait until the next business day, you can page your doctor at the number below.    Please note that while we do our best to be available for urgent issues outside of office hours, we are not available 24/7.   If you have an urgent issue and are unable to reach [place], you may choose to seek medical care at your doctor's office, retail clinic, urgent care center, or emergency room.  If you have a medical emergency, please immediately call 911 or go to the emergency department.  Pager Numbers  - Dr. Nehemiah Massed: [phone]  - Dr. Laurence Ferrari: [phone]  - Dr. Nicole Kindred: [phone]  In the event of inclement weather, please call our main line at [phone] for an update on the status of any delays or closures.  Dermatology Medication Tips: Please keep the boxes that topical medications come in in order to help keep track of the instructions about where and how to use these. Pharmacies typically print the medication instructions only on the boxes and not directly on the medication tubes.   If your medication is too expensive, please contact our office at [phone] option 4 or send [place] a message through [place].   We are unable to tell what your co-pay for medications will be in advance as this is different depending on your insurance coverage. However, we may be able to find a substitute medication at lower cost or fill out paperwork to get insurance to cover a needed medication.   If a prior authorization is required to get your medication covered by your insurance company, please allow [place] 1-2 business days to  complete this process.  Drug prices often vary depending on where the prescription is filled and some pharmacies may offer cheaper prices.  The website www.goodrx.com contains coupons for medications through different pharmacies. The prices here do not account for what the cost may be with help from insurance (it may be cheaper with your insurance), but the website can give you the price if you did not use any insurance.  - You can print the associated coupon and take it with your prescription to the pharmacy.  - You may also stop by our office during regular business hours and pick up a GoodRx coupon card.  - If you need your prescription sent electronically to a different pharmacy, notify our office through [facility] or by phone at [phone] option 4.     Si Usted Necesita Algo Despus de Su Visita  Tambin puede enviarnos un mensaje a travs de Pharmacist, community. Por lo general respondemos a los mensajes de MyChart en el transcurso de 1 a 2 das hbiles.  Para renovar recetas, por favor pida a su farmacia que se ponga en contacto con nuestra oficina. Harland Dingwall de fax es [place] [phone].  Si tiene un asunto urgente cuando la clnica est cerrada y que no puede esperar hasta el siguiente da hbil, puede llamar/localizar a su doctor(a) al nmero que aparece a continuacin.   Por favor, tenga en cuenta que aunque hacemos todo lo posible para estar disponibles para asuntos urgentes fuera del horario de [place], no estamos disponibles las 24 horas del da, los 7 das de la [place].   Si tiene un problema urgente y no puede comunicarse con nosotros, puede optar por buscar atencin mdica  en el consultorio de su doctor(a), en una clnica privada, en un centro de atencin urgente o en una sala de emergencias.  Si tiene Engineering geologist, por favor llame inmediatamente al 911 o vaya a la sala de emergencias.  Nmeros de bper  - Dr. Nehemiah Massed: [phone]  - Dra. Moye:  [phone]  - Dra. Nicole Kindred: [phone]  En caso de inclemencias del [place], por favor llame a Johnsie Kindred principal al [phone] para una actualizacin sobre el [place] de cualquier retraso o cierre.  Consejos para la medicacin en dermatologa: Por favor, guarde las cajas en las que vienen los medicamentos de uso tpico para ayudarle a seguir las instrucciones sobre dnde y cmo usarlos. Las farmacias generalmente imprimen las instrucciones del medicamento slo en las cajas y no directamente en los tubos del [place].   Si su medicamento es muy caro, por favor, pngase en contacto con Zigmund Daniel llamando al [phone] y presione la opcin 4 o envenos un  mensaje a travs de MyChart.   No podemos decirle cul ser su copago por los medicamentos por adelantado ya que esto es diferente dependiendo de la cobertura de su seguro. Sin embargo, es posible que podamos encontrar un medicamento sustituto a Electrical engineer un formulario para que el seguro cubra el medicamento que se considera necesario.   Si se requiere una autorizacin previa para que su compaa de seguros [place] su medicamento, por favor permtanos de 1 a 2 das hbiles para completar este proceso.  Los precios de los medicamentos varan con frecuencia dependiendo del Environmental consultant de dnde se surte la receta y alguna farmacias pueden ofrecer precios ms baratos.  El sitio web www.goodrx.com tiene cupones para medicamentos de Airline pilot. Los precios aqu no tienen en cuenta lo que podra costar con la ayuda del seguro (puede ser ms barato con su seguro), pero el sitio web puede darle el precio si no utiliz Research scientist (physical sciences).  - Puede imprimir el cupn correspondiente y llevarlo con su receta a la farmacia.  - Tambin puede pasar por nuestra oficina durante el horario de atencin regular y Charity fundraiser una tarjeta de cupones de GoodRx.  - Si necesita que su receta se enve electrnicamente a una farmacia diferente,  informe a nuestra oficina a travs de MyChart de [place] o por telfono llamando al [phone] y presione la opcin 4.

## 2021-04-19 NOTE — Progress Notes (Signed)
Follow-Up Visit   Subjective  Karen Washington is a 65 y.o. female who presents for the following: Follow-up (Patient here today for 1 year tbse. Has history of isk and aks. ). The patient presents for Total-Body Skin Exam (TBSE) for skin cancer screening and mole check.  The patient has spots, moles and lesions to be evaluated, some may be new or changing and the patient has concerns that these could be cancer.  The following portions of the chart were reviewed this encounter and updated as appropriate:  Tobacco  Allergies  Meds  Problems  Med Hx  Surg Hx  Fam Hx     Review of Systems: No other skin or systemic complaints except as noted in HPI or Assessment and Plan.  Objective  Well appearing patient in no apparent distress; mood and affect are within normal limits.  A full examination was performed including scalp, head, eyes, ears, nose, lips, neck, chest, axillae, abdomen, back, buttocks, bilateral upper extremities, bilateral lower extremities, hands, feet, fingers, toes, fingernails, and toenails. All findings within normal limits unless otherwise noted below.  forehead and temple Erythematous thin papules/macules with gritty scale.   Assessment & Plan  Actinic keratosis forehead and temple  Actinic keratoses are precancerous spots that appear secondary to cumulative UV radiation exposure/sun exposure over time. They are chronic with expected duration over 1 year. A portion of actinic keratoses will progress to squamous cell carcinoma of the skin. It is not possible to reliably predict which spots will progress to skin cancer and so treatment is recommended to prevent development of skin cancer.  Recommend daily broad spectrum sunscreen SPF 30+ to sun-exposed areas, reapply every 2 hours as needed.  Recommend staying in the shade or wearing long sleeves, sun glasses (UVA+UVB protection) and wide brim hats (4-inch brim around the entire circumference of the hat). Call for new  or changing lesions.  Actinic Damage - Severe, confluent actinic changes with pre-cancerous actinic keratoses  - Severe, chronic, not at goal, secondary to cumulative UV radiation exposure over time - diffuse scaly erythematous macules and papules with underlying dyspigmentation - Discussed Prescription "Field Treatment" for Severe, Chronic Confluent Actinic Changes with Pre-Cancerous Actinic Keratoses Field treatment involves treatment of an entire area of skin that has confluent Actinic Changes (Sun/ Ultraviolet light damage) and PreCancerous Actinic Keratoses by method of PhotoDynamic Therapy (PDT) and/or prescription Topical Chemotherapy agents such as 5-fluorouracil, 5-fluorouracil/calcipotriene, and/or imiquimod.  The purpose is to decrease the number of clinically evident and subclinical PreCancerous lesions to prevent progression to development of skin cancer by chemically destroying early precancer changes that may or may not be visible.  It has been shown to reduce the risk of developing skin cancer in the treated area. As a result of treatment, redness, scaling, crusting, and open sores may occur during treatment course. One or more than one of these methods may be used and may have to be used several times to control, suppress and eliminate the PreCancerous changes. Discussed treatment course, expected reaction, and possible side effects. - Recommend daily broad spectrum sunscreen SPF 30+ to sun-exposed areas, reapply every 2 hours as needed.  - Staying in the shade or wearing long sleeves, sun glasses (UVA+UVB protection) and wide brim hats (4-inch brim around the entire circumference of the hat) are also recommended. - Call for new or changing lesions.  Patient will start cream in January - Start 5-fluorouracil/calcipotriene cream twice a day for 7 days to affected areas including forehead and  temple . Prescription sent to [facility]. Patient advised they will receive an  email to purchase the medication online and have it sent to their home. Patient provided with handout reviewing treatment course and side effects and advised to call or message [place] on MyChart with any concerns.  fluorouracil (EFUDEX) 5 % cream - forehead and temple Apply topically 2 (two) times daily. Apply for 7 days to temple and forehead  Skin cancer screening  Lentigines - Scattered tan macules - Due to sun exposure - Benign-appearing, observe - Recommend daily broad spectrum sunscreen SPF 30+ to sun-exposed areas, reapply every 2 hours as needed. - Call for any changes  Seborrheic Keratoses - Stuck-on, waxy, tan-brown papules and/or plaques at back, chest  - Benign-appearing - Discussed benign etiology and prognosis. - Observe - Call for any changes  Melanocytic Nevi - Tan-brown and/or pink-flesh-colored symmetric macules and papules - Benign appearing on exam today - Observation - Call clinic for new or changing moles - Recommend daily use of broad spectrum spf 30+ sunscreen to sun-exposed areas.   Hemangiomas - Red papules - Discussed benign nature - Observe - Call for any changes  Actinic Damage - Chronic condition, secondary to cumulative UV/sun exposure - diffuse scaly erythematous macules with underlying dyspigmentation - Recommend daily broad spectrum sunscreen SPF 30+ to sun-exposed areas, reapply every 2 hours as needed.  - Staying in the shade or wearing long sleeves, sun glasses (UVA+UVB protection) and wide brim hats (4-inch brim around the entire circumference of the hat) are also recommended for sun protection.  - Call for new or changing lesions.  Skin cancer screening performed today.  Return for 3 - 4 month , ak followup. IRuthell Rummage, CMA, am acting as scribe for Sarina Ser, MD.  Documentation: I have reviewed the above documentation for accuracy and completeness, and I agree with the above.  Sarina Ser, MD

## 2021-04-27 ENCOUNTER — Encounter: Payer: Self-pay | Admitting: Dermatology

## 2021-06-02 ENCOUNTER — Encounter: Payer: Self-pay | Admitting: Dermatology

## 2021-07-19 ENCOUNTER — Other Ambulatory Visit: Payer: Self-pay

## 2021-07-19 ENCOUNTER — Encounter: Payer: Self-pay | Admitting: Dermatology

## 2021-07-19 ENCOUNTER — Ambulatory Visit: Payer: BC Managed Care – PPO | Admitting: Dermatology

## 2021-07-19 DIAGNOSIS — L578 Other skin changes due to chronic exposure to nonionizing radiation: Secondary | ICD-10-CM

## 2021-07-19 DIAGNOSIS — R599 Enlarged lymph nodes, unspecified: Secondary | ICD-10-CM

## 2021-07-19 DIAGNOSIS — H0012 Chalazion right lower eyelid: Secondary | ICD-10-CM

## 2021-07-19 DIAGNOSIS — Z872 Personal history of diseases of the skin and subcutaneous tissue: Secondary | ICD-10-CM

## 2021-07-19 DIAGNOSIS — L814 Other melanin hyperpigmentation: Secondary | ICD-10-CM

## 2021-07-19 DIAGNOSIS — L821 Other seborrheic keratosis: Secondary | ICD-10-CM

## 2021-07-19 NOTE — Progress Notes (Signed)
? ?  Follow-Up Visit ?  ?Subjective  ?Karen Washington is a 66 y.o. female who presents for the following: Actinic Keratosis (Patient did use the 5FU/Calcipotriene on the face and did get erythema and irritation. She did have to use it for about 10 days. ) and Lesion (Under R eye - has been there for about 6 weeks. Some swelling but no other symptoms.). ? ?The following portions of the chart were reviewed this encounter and updated as appropriate:  ? Tobacco  Allergies  Meds  Problems  Med Hx  Surg Hx  Fam Hx   ?  ?Review of Systems:  No other skin or systemic complaints except as noted in HPI or Assessment and Plan. ? ?Objective  ?Well appearing patient in no apparent distress; mood and affect are within normal limits. ? ?A focused examination was performed including the face. Relevant physical exam findings are noted in the Assessment and Plan. ? ?Face ?Clear.  ? ?R lower lat eyelid ?0.5 cm edema  ? ? ?Assessment & Plan  ?History of actinic keratosis ?Face ?Good response to 5FU/Calcipotriene cream BID x 10 days -  ?Clear. Observe for recurrence. Call clinic for new or changing lesions.  Recommend regular skin exams, daily broad-spectrum spf 30+ sunscreen use, and photoprotection.   ? ?Swollen meibomian gland - meibomian eyelid cyst ?R lower lat eyelid ?- recommend following up with optometrist Dr. Glennon Mac who may recommend medical treatment or referral to an ophthalmologist for surgical intervention if indicated. She has an appointment scheduled soon, not necessary to schedule a sooner appointment.  ? ?Lentigines ?- Scattered tan macules ?- Due to sun exposure ?- Benign-appering, observe ?- Recommend daily broad spectrum sunscreen SPF 30+ to sun-exposed areas, reapply every 2 hours as needed. ?- Call for any changes ? ?Seborrheic Keratoses ?- Stuck-on, waxy, tan-brown papules and/or plaques  ?- Benign-appearing ?- Discussed benign etiology and prognosis. ?- Observe ?- Call for any changes ? ?Actinic Damage ?-  chronic, secondary to cumulative UV radiation exposure/sun exposure over time ?- diffuse scaly erythematous macules with underlying dyspigmentation ?- Recommend daily broad spectrum sunscreen SPF 30+ to sun-exposed areas, reapply every 2 hours as needed.  ?- Recommend staying in the shade or wearing long sleeves, sun glasses (UVA+UVB protection) and wide brim hats (4-inch brim around the entire circumference of the hat). ?- Call for new or changing lesions. ? ?Return in about 9 months (around 04/20/2022) for TBSE. ? ?I, Rudell Cobb, CMA, am acting as scribe for Sarina Ser, MD . ?Documentation: I have reviewed the above documentation for accuracy and completeness, and I agree with the above. ? ?Sarina Ser, MD ? ?

## 2021-07-19 NOTE — Patient Instructions (Signed)

## 2021-12-01 ENCOUNTER — Encounter: Payer: BC Managed Care – PPO | Admitting: Internal Medicine

## 2022-01-03 ENCOUNTER — Encounter: Payer: Self-pay | Admitting: Internal Medicine

## 2022-01-03 ENCOUNTER — Ambulatory Visit (INDEPENDENT_AMBULATORY_CARE_PROVIDER_SITE_OTHER): Payer: BC Managed Care – PPO | Admitting: Internal Medicine

## 2022-01-03 VITALS — BP 106/68 | HR 63 | Temp 98.1°F | Ht 65.0 in | Wt 134.2 lb

## 2022-01-03 DIAGNOSIS — Z0000 Encounter for general adult medical examination without abnormal findings: Secondary | ICD-10-CM | POA: Diagnosis not present

## 2022-01-03 DIAGNOSIS — E785 Hyperlipidemia, unspecified: Secondary | ICD-10-CM | POA: Diagnosis not present

## 2022-01-03 DIAGNOSIS — Z114 Encounter for screening for human immunodeficiency virus [HIV]: Secondary | ICD-10-CM

## 2022-01-03 DIAGNOSIS — Z1231 Encounter for screening mammogram for malignant neoplasm of breast: Secondary | ICD-10-CM

## 2022-01-03 DIAGNOSIS — R5383 Other fatigue: Secondary | ICD-10-CM

## 2022-01-03 DIAGNOSIS — R921 Mammographic calcification found on diagnostic imaging of breast: Secondary | ICD-10-CM

## 2022-01-03 DIAGNOSIS — Z853 Personal history of malignant neoplasm of breast: Secondary | ICD-10-CM

## 2022-01-03 DIAGNOSIS — R7301 Impaired fasting glucose: Secondary | ICD-10-CM | POA: Diagnosis not present

## 2022-01-03 DIAGNOSIS — Z1211 Encounter for screening for malignant neoplasm of colon: Secondary | ICD-10-CM

## 2022-01-03 MED ORDER — ALPRAZOLAM 0.25 MG PO TABS: 0.2500 mg | ORAL_TABLET | Freq: Every evening | ORAL | 0 refills | Status: AC | PRN

## 2022-01-03 NOTE — Assessment & Plan Note (Addendum)
She underwent biopsy of right breast in Oct 2022.  Pathology revealed BENIGN BREAST TISSUE WITH DENSE STROMAL FIBROSIS, CALCIFICATIONS ARE NOTED IN ASSOCIATION WITH BENIGN LOBULES AND FIBROSIS- NEGATIVE FOR ATYPIA / EIN AND MALIGNANCY of the RIGHT.  Annual screening is due in Oct 2022

## 2022-01-03 NOTE — Progress Notes (Signed)
The patient is here for annual preventive examination and management of other chronic and acute problems.   The risk factors are reflected in the social history.   The roster of all physicians providing medical care to patient - is listed in the Snapshot section of the chart.   Activities of daily living:  The patient is 100% independent in all ADLs: dressing, toileting, feeding as well as independent mobility   Home safety : The patient has smoke detectors in the home. They wear seatbelts.  There are no unsecured firearms at home. There is no violence in the home.    There is no risks for hepatitis, STDs or HIV. There is no   history of blood transfusion. They have no travel history to infectious disease endemic areas of the world.   The patient has seen their dentist in the last six month. They have seen their eye doctor in the last year. The patient  denies slight hearing difficulty with regard to whispered voices and some television programs.  They have deferred audiologic testing in the last year.  They do not  have excessive sun exposure. Discussed the need for sun protection: hats, long sleeves and use of sunscreen if there is significant sun exposure.    Diet: the importance of a healthy diet is discussed. They do have a healthy diet.   The benefits of regular aerobic exercise were discussed. The patient  exercises  3 to 5 days per week  for  60 minutes.    Depression screen: there are no signs or vegative symptoms of depression- irritability, change in appetite, anhedonia, sadness/tearfullness.   The following portions of the patient's history were reviewed and updated as appropriate: allergies, current medications, past family history, past medical history,  past surgical history, past social history  and problem list.   Visual acuity was not assessed per patient preference since the patient has regular follow up with an  ophthalmologist. Hearing and body mass index were assessed  and reviewed.    During the course of the visit the patient was educated and counseled about appropriate screening and preventive services including : fall prevention , diabetes screening, nutrition counseling, colorectal cancer screening, and recommended immunizations.    Chief Complaint   None   Review of Symptoms  Patient denies headache, fevers, malaise, unintentional weight loss, skin rash, eye pain, sinus congestion and sinus pain, sore throat, dysphagia,  hemoptysis , cough, dyspnea, wheezing, chest pain, palpitations, orthopnea, edema, abdominal pain, nausea, melena, diarrhea, constipation, flank pain, dysuria, hematuria, urinary  Frequency, nocturia, numbness, tingling, seizures,  Focal weakness, Loss of consciousness,  Tremor, insomnia, depression, anxiety, and suicidal ideation.    Physical Exam:  BP 106/68 (BP Location: Left Arm, Patient Position: Sitting, Cuff Size: Normal)   Pulse 63   Temp 98.1 F (36.7 C) (Oral)   Ht '5\' 5"'$  (1.651 m)   Wt 134 lb 3.2 oz (60.9 kg)   SpO2 96%   BMI 22.33 kg/m    General appearance: alert, cooperative and appears stated age Head: Normocephalic, without obvious abnormality, atraumatic Eyes: conjunctivae/corneas clear. PERRL, EOM's intact. Fundi benign. Ears: normal TM's and external ear canals both ears Nose: Nares normal. Septum midline. Mucosa normal. No drainage or sinus tenderness. Throat: lips, mucosa, and tongue normal; teeth and gums normal Neck: no adenopathy, no carotid bruit, no JVD, supple, symmetrical, trachea midline and thyroid not enlarged, symmetric, no tenderness/mass/nodules Lungs: clear to auscultation bilaterally Breasts: bilateral breast scars  normal appearance, no masses  or tenderness Heart: regular rate and rhythm, S1, S2 normal, no murmur, click, rub or gallop Abdomen: soft, non-tender; bowel sounds normal; no masses,  no organomegaly Extremities: extremities normal, atraumatic, no cyanosis or edema Pulses: 2+  and symmetric Skin: Skin color, texture, turgor normal. No rashes or lesions Neurologic: Alert and oriented X 3, normal strength and tone. Normal symmetric reflexes. Normal coordination and gait.     Assessment and Plan:  Encounter for preventive health examination age appropriate education and counseling updated, referrals for preventative services and immunizations addressed, dietary and smoking counseling addressed, most recent labs reviewed.  I have personally reviewed and have noted:   1) the patient's medical and social history 2) The pt's use of alcohol, tobacco, and illicit drugs 3) The patient's current medications and supplements 4) Functional ability including ADL's, fall risk, home safety risk, hearing and visual impairment 5) Diet and physical activities 6) Evidence for depression or mood disorder 7) The patient's height, weight, and BMI have been recorded in the chart  I have made referrals, and provided counseling and education based on review of the above  Breast calcifications She underwent biopsy of right breast in Oct 2022.  Pathology revealed BENIGN BREAST TISSUE WITH DENSE STROMAL FIBROSIS, CALCIFICATIONS ARE NOTED IN ASSOCIATION WITH BENIGN LOBULES AND FIBROSIS- NEGATIVE FOR ATYPIA / EIN AND MALIGNANCY of the RIGHT.  Annual screening is due in Oct 2022   Updated Medication List Outpatient Encounter Medications as of 01/03/2022  Medication Sig   cholecalciferol (VITAMIN D) 1000 units tablet Take 1,000 Units by mouth daily.   naproxen sodium (ALEVE) 220 MG tablet Take 220 mg by mouth daily as needed.   [DISCONTINUED] ALPRAZolam (XANAX) 0.25 MG tablet TAKE ONE TABLET BY MOUTH AT BEDTIME AS NEEDED FOR SLEEP   ALPRAZolam (XANAX) 0.25 MG tablet Take 1 tablet (0.25 mg total) by mouth at bedtime as needed. for sleep   [DISCONTINUED] acetaminophen (TYLENOL) 500 MG tablet Take 500 mg by mouth every 6 (six) hours as needed.   [DISCONTINUED] fluorouracil (EFUDEX) 5 % cream  Apply topically 2 (two) times daily. Apply for 7 days to temple and forehead   [DISCONTINUED] Melatonin 10 MG TABS Take 1 tablet by mouth at bedtime as needed.   [DISCONTINUED] Misc Natural Products (COSAMIN ASU FOR JOINT HEALTH PO) Take 2 capsules by mouth daily. Pt stated Mon, Wed, Fri she takes 2 capsules and all other days she takes one.   No facility-administered encounter medications on file as of 01/03/2022.

## 2022-01-03 NOTE — Assessment & Plan Note (Signed)

## 2022-01-03 NOTE — Patient Instructions (Signed)
Your annual mammogram has been ordered AND IS DUE in LATE SEPTEMBER.  [facility] will not allow [place] to schedule it for you,  so please  call to make your appointment [phone]

## 2022-01-04 LAB — CBC WITH DIFFERENTIAL/PLATELET
Basophils Absolute: 0.1 10*3/uL (ref 0.0–0.1)
Basophils Relative: 1.1 % (ref 0.0–3.0)
Eosinophils Absolute: 0.1 10*3/uL (ref 0.0–0.7)
Eosinophils Relative: 1.5 % (ref 0.0–5.0)
HCT: 40 % (ref 36.0–46.0)
Hemoglobin: 13.4 g/dL (ref 12.0–15.0)
Lymphocytes Relative: 26.3 % (ref 12.0–46.0)
Lymphs Abs: 1.6 10*3/uL (ref 0.7–4.0)
MCHC: 33.6 g/dL (ref 30.0–36.0)
MCV: 93.2 fl (ref 78.0–100.0)
Monocytes Absolute: 0.6 10*3/uL (ref 0.1–1.0)
Monocytes Relative: 9.9 % (ref 3.0–12.0)
Neutro Abs: 3.8 10*3/uL (ref 1.4–7.7)
Neutrophils Relative %: 61.2 % (ref 43.0–77.0)
Platelets: 250 10*3/uL (ref 150.0–400.0)
RBC: 4.29 Mil/uL (ref 3.87–5.11)
RDW: 13.3 % (ref 11.5–15.5)
WBC: 6.2 10*3/uL (ref 4.0–10.5)

## 2022-01-04 LAB — COMPREHENSIVE METABOLIC PANEL
ALT: 13 U/L (ref 0–35)
AST: 16 U/L (ref 0–37)
Albumin: 4.3 g/dL (ref 3.5–5.2)
Alkaline Phosphatase: 50 U/L (ref 39–117)
BUN: 19 mg/dL (ref 6–23)
CO2: 30 mEq/L (ref 19–32)
Calcium: 9.4 mg/dL (ref 8.4–10.5)
Chloride: 100 mEq/L (ref 96–112)
Creatinine, Ser: 0.96 mg/dL (ref 0.40–1.20)
GFR: 62.65 mL/min (ref >60.00)
Glucose, Bld: 85 mg/dL (ref 70–99)
Potassium: 3.8 mEq/L (ref 3.5–5.1)
Sodium: 140 mEq/L (ref 135–145)
Total Bilirubin: 0.5 mg/dL (ref 0.2–1.2)
Total Protein: 6.6 g/dL (ref 6.0–8.3)

## 2022-01-04 LAB — LIPID PANEL
Cholesterol: 197 mg/dL (ref 0–200)
HDL: 82.7 mg/dL (ref >39.00)
LDL Cholesterol: 95 mg/dL (ref 0–99)
NonHDL: 114.56
Total CHOL/HDL Ratio: 2
Triglycerides: 100 mg/dL (ref 0.0–149.0)
VLDL: 20 mg/dL (ref 0.0–40.0)

## 2022-01-04 LAB — CANCER ANTIGEN 27.29: CA 27.29: 13 U/mL (ref <38)

## 2022-01-04 LAB — HEMOGLOBIN A1C: Hgb A1c MFr Bld: 6 % (ref 4.6–6.5)

## 2022-01-04 LAB — LDL CHOLESTEROL, DIRECT: Direct LDL: 97 mg/dL

## 2022-01-04 LAB — TSH: TSH: 1.31 u[IU]/mL (ref 0.35–5.50)

## 2022-01-04 LAB — HIV ANTIBODY (ROUTINE TESTING W REFLEX): HIV 1&2 Ab, 4th Generation: NONREACTIVE

## 2022-03-03 ENCOUNTER — Ambulatory Visit
Admission: RE | Admit: 2022-03-03 | Discharge: 2022-03-03 | Disposition: A | Discharge status: Home / Self Care | Payer: BC Managed Care – PPO | Source: Ambulatory Visit | Attending: Internal Medicine | Admitting: Internal Medicine

## 2022-03-03 DIAGNOSIS — Z1231 Encounter for screening mammogram for malignant neoplasm of breast: Secondary | ICD-10-CM | POA: Diagnosis present

## 2022-03-14 ENCOUNTER — Encounter (INDEPENDENT_AMBULATORY_CARE_PROVIDER_SITE_OTHER): Payer: Self-pay

## 2022-04-25 ENCOUNTER — Ambulatory Visit: Payer: BC Managed Care – PPO | Admitting: Dermatology

## 2022-04-25 DIAGNOSIS — D229 Melanocytic nevi, unspecified: Secondary | ICD-10-CM

## 2022-04-25 DIAGNOSIS — L570 Actinic keratosis: Secondary | ICD-10-CM | POA: Diagnosis not present

## 2022-04-25 DIAGNOSIS — B078 Other viral warts: Secondary | ICD-10-CM

## 2022-04-25 DIAGNOSIS — Z1283 Encounter for screening for malignant neoplasm of skin: Secondary | ICD-10-CM

## 2022-04-25 DIAGNOSIS — Z5111 Encounter for antineoplastic chemotherapy: Secondary | ICD-10-CM

## 2022-04-25 DIAGNOSIS — L578 Other skin changes due to chronic exposure to nonionizing radiation: Secondary | ICD-10-CM | POA: Diagnosis not present

## 2022-04-25 DIAGNOSIS — L814 Other melanin hyperpigmentation: Secondary | ICD-10-CM

## 2022-04-25 DIAGNOSIS — L821 Other seborrheic keratosis: Secondary | ICD-10-CM

## 2022-04-25 DIAGNOSIS — Z79899 Other long term (current) drug therapy: Secondary | ICD-10-CM

## 2022-04-25 DIAGNOSIS — L820 Inflamed seborrheic keratosis: Secondary | ICD-10-CM | POA: Diagnosis not present

## 2022-04-25 NOTE — Progress Notes (Signed)
Follow-Up Visit   Subjective  Karen Washington is a 66 y.o. female who presents for the following: Annual Exam. Hx of precancers. The patient presents for Total-Body Skin Exam (TBSE) for skin cancer screening and mole check.  The patient has spots, moles and lesions to be evaluated, some may be new or changing and the patient has concerns that these could be cancer.   The following portions of the chart were reviewed this encounter and updated as appropriate:   Tobacco  Allergies  Meds  Problems  Med Hx  Surg Hx  Fam Hx     Review of Systems:  No other skin or systemic complaints except as noted in HPI or Assessment and Plan.  Objective  Well appearing patient in no apparent distress; mood and affect are within normal limits.  A full examination was performed including scalp, head, eyes, ears, nose, lips, neck, chest, axillae, abdomen, back, buttocks, bilateral upper extremities, bilateral lower extremities, hands, feet, fingers, toes, fingernails, and toenails. All findings within normal limits unless otherwise noted below.  right posterior shoulder x 1, right temple x 1 chest x 2  (4) (4) Stuck-on, waxy, tan-brown papule-- Discussed benign etiology and prognosis.   right nose x 1, chest x 5  (6) (6) Erythematous thin papules/macules with gritty scale.   right hand between 3rd web space Verrucous papules -- Discussed viral etiology and contagion.    Assessment & Plan  Inflamed seborrheic keratosis (4) right posterior shoulder x 1, right temple x 1 chest x 2  (4)  Symptomatic, irritating, patient would like treated.   Destruction of lesion - right posterior shoulder x 1, right temple x 1 chest x 2  (4) Complexity: simple   Destruction method: cryotherapy   Informed consent: discussed and consent obtained   Timeout:  patient name, date of birth, surgical site, and procedure verified Lesion destroyed using liquid nitrogen: Yes   Region frozen until ice ball extended beyond  lesion: Yes   Outcome: patient tolerated procedure well with no complications   Post-procedure details: wound care instructions given    AK (actinic keratosis) (6) right nose x 1, chest x 5  (6)  Actinic keratoses are precancerous spots that appear secondary to cumulative UV radiation exposure/sun exposure over time. They are chronic with expected duration over 1 year. A portion of actinic keratoses will progress to squamous cell carcinoma of the skin. It is not possible to reliably predict which spots will progress to skin cancer and so treatment is recommended to prevent development of skin cancer.  Recommend daily broad spectrum sunscreen SPF 30+ to sun-exposed areas, reapply every 2 hours as needed.  Recommend staying in the shade or wearing long sleeves, sun glasses (UVA+UVB protection) and wide brim hats (4-inch brim around the entire circumference of the hat). Call for new or changing lesions.   Destruction of lesion - right nose x 1, chest x 5  (6) Complexity: simple   Destruction method: cryotherapy   Informed consent: discussed and consent obtained   Timeout:  patient name, date of birth, surgical site, and procedure verified Lesion destroyed using liquid nitrogen: Yes   Region frozen until ice ball extended beyond lesion: Yes   Outcome: patient tolerated procedure well with no complications   Post-procedure details: wound care instructions given    Other viral warts right hand between 3rd web space  Viral Wart (HPV) Counseling  Discussed viral / HPV (Human Papilloma Virus) etiology and risk of spread /infectivity to  other areas of body as well as to other people.  Multiple treatments and methods may be required to clear warts and it is possible treatment may not be successful.  Treatment risks include discoloration; scarring and there is still potential for wart recurrence.   Destruction of lesion - right hand between 3rd web space Complexity: simple   Destruction method:  cryotherapy   Informed consent: discussed and consent obtained   Timeout:  patient name, date of birth, surgical site, and procedure verified Lesion destroyed using liquid nitrogen: Yes   Region frozen until ice ball extended beyond lesion: Yes   Outcome: patient tolerated procedure well with no complications   Post-procedure details: wound care instructions given    Lentigines - Scattered tan macules - Due to sun exposure - Benign-appearing, observe - Recommend daily broad spectrum sunscreen SPF 30+ to sun-exposed areas, reapply every 2 hours as needed. - Call for any changes  Seborrheic Keratoses - Stuck-on, waxy, tan-brown papules and/or plaques  - Benign-appearing - Discussed benign etiology and prognosis. - Observe - Call for any changes  Melanocytic Nevi - Tan-brown and/or pink-flesh-colored symmetric macules and papules - Benign appearing on exam today - Observation - Call clinic for new or changing moles - Recommend daily use of broad spectrum spf 30+ sunscreen to sun-exposed areas.   Hemangiomas - Red papules - Discussed benign nature - Observe - Call for any changes  Actinic Damage with PreCancerous Actinic Keratoses Counseling for Topical Chemotherapy Management: Patient exhibits: - Severe, confluent actinic changes with pre-cancerous actinic keratoses that is secondary to cumulative UV radiation exposure over time - Condition that is severe; chronic, not at goal. - diffuse scaly erythematous macules and papules with underlying dyspigmentation - Discussed Prescription "Field Treatment" topical Chemotherapy for Severe, Chronic Confluent Actinic Changes with Pre-Cancerous Actinic Keratoses  Begin in January  Start 27f/Calcipotriene cream apply to chest twice a day x 7 days  Field treatment involves treatment of an entire area of skin that has confluent Actinic Changes (Sun/ Ultraviolet light damage) and PreCancerous Actinic Keratoses by method of PhotoDynamic  Therapy (PDT) and/or prescription Topical Chemotherapy agents such as 5-fluorouracil, 5-fluorouracil/calcipotriene, and/or imiquimod.  The purpose is to decrease the number of clinically evident and subclinical PreCancerous lesions to prevent progression to development of skin cancer by chemically destroying early precancer changes that may or may not be visible.  It has been shown to reduce the risk of developing skin cancer in the treated area. As a result of treatment, redness, scaling, crusting, and open sores may occur during treatment course. One or more than one of these methods may be used and may have to be used several times to control, suppress and eliminate the PreCancerous changes. Discussed treatment course, expected reaction, and possible side effects. - Recommend daily broad spectrum sunscreen SPF 30+ to sun-exposed areas, reapply every 2 hours as needed.  - Staying in the shade or wearing long sleeves, sun glasses (UVA+UVB protection) and wide brim hats (4-inch brim around the entire circumference of the hat) are also recommended. - Call for new or changing lesions.   Skin cancer screening performed today.   Return in about 6 months (around 10/25/2022) for AKs-sun exposed areas .  I,Marye Round CMA, am acting as scribe for DSarina Ser MD .  Documentation: I have reviewed the above documentation for accuracy and completeness, and I agree with the above.  DSarina Ser MD

## 2022-04-25 NOTE — Patient Instructions (Addendum)
Instructions for [facility] Medications  One or more of your medications was sent to the [facility] mail order compounding pharmacy. You will receive an email from them and can purchase the medicine through that link. It will then be mailed to your home at the address you confirmed. If for any reason you do not receive an email from them, please check your spam folder. If you still do not find the email, please let us know. [facility] phone number is [phone].     5-Fluorouracil/Calcipotriene Patient Education   Actinic keratoses are the dry, red scaly spots on the skin caused by sun damage. A portion of these spots can turn into skin cancer with time, and treating them can help prevent development of skin cancer.   Treatment of these spots requires removal of the defective skin cells. There are various ways to remove actinic keratoses, including freezing with liquid nitrogen, treatment with creams, or treatment with a blue light procedure in the office.   5-fluorouracil cream is a topical cream used to treat actinic keratoses. It works by interfering with the growth of abnormal fast-growing skin cells, such as actinic keratoses. These cells peel off and are replaced by healthy ones.   5-fluorouracil/calcipotriene is a combination of the 5-fluorouracil cream with a vitamin D analog cream called calcipotriene. The calcipotriene alone does not treat actinic keratoses. However, when it is combined with 5-fluorouracil, it helps the 5-fluorouracil treat the actinic keratoses much faster so that the same results can be achieved with a much shorter treatment time.  INSTRUCTIONS FOR 5-FLUOROURACIL/CALCIPOTRIENE CREAM:   5-fluorouracil/calcipotriene cream typically only needs to be used for 4-7 days. A thin layer should be applied twice a day to the treatment areas recommended by your physician.   If your physician prescribed you separate tubes of 5-fluourouracil and calcipotriene,  apply a thin layer of 5-fluorouracil followed by a thin layer of calcipotriene.   Avoid contact with your eyes, nostrils, and mouth. Do not use 5-fluorouracil/calcipotriene cream on infected or open wounds.   You will develop redness, irritation and some crusting at areas where you have pre-cancer damage/actinic keratoses. IF YOU DEVELOP PAIN, BLEEDING, OR SIGNIFICANT CRUSTING, STOP THE TREATMENT EARLY - you have already gotten a good response and the actinic keratoses should clear up well.  Wash your hands after applying 5-fluorouracil 5% cream on your skin.   A moisturizer or sunscreen with a minimum SPF 30 should be applied each morning.   Once you have finished the treatment, you can apply a thin layer of Vaseline twice a day to irritated areas to soothe and calm the areas more quickly. If you experience significant discomfort, contact your physician.  For some patients it is necessary to repeat the treatment for best results.  SIDE EFFECTS: When using 5-fluorouracil/calcipotriene cream, you may have mild irritation, such as redness, dryness, swelling, or a mild burning sensation. This usually resolves within 2 weeks. The more actinic keratoses you have, the more redness and inflammation you can expect during treatment. Eye irritation has been reported rarely. If this occurs, please let us know.  If you have any trouble using this cream, please call the office. If you have any other questions about this information, please do not hesitate to ask me before you leave the office.      Cryotherapy Aftercare  Wash gently with soap and water everyday.   Apply Vaseline and Band-Aid daily until healed.     Due to recent changes in healthcare laws,  you may see results of your pathology and/or laboratory studies on MyChart before the doctors have had a chance to review them. We understand that in some cases there may be results that are confusing or concerning to you. Please understand that not  all results are received at the same time and often the doctors may need to interpret multiple results in order to provide you with the best plan of care or course of treatment. Therefore, we ask that you please give [place] 2 business days to thoroughly review all your results before contacting the office for clarification. Should we see a critical lab result, you will be contacted sooner.   If You Need Anything After Your Visit  If you have any questions or concerns for your doctor, please call our main line at [phone] and press option 4 to reach your doctor's medical assistant. If no one answers, please leave a voicemail as directed and we will return your call as soon as possible. Messages left after 4 pm will be answered the following business day.   You may also send [place] a message via [place]. We typically respond to MyChart messages within 1-2 business days.  For prescription refills, please ask your pharmacy to contact our office. Our fax number is [phone].  If you have an urgent issue when the clinic is closed that cannot wait until the next business day, you can page your doctor at the number below.    Please note that while we do our best to be available for urgent issues outside of office hours, we are not available 24/7.   If you have an urgent issue and are unable to reach [place], you may choose to seek medical care at your doctor's office, retail clinic, urgent care center, or emergency room.  If you have a medical emergency, please immediately call 911 or go to the emergency department.  Pager Numbers  - Dr. Nehemiah Massed: [phone]  - Dr. Laurence Ferrari: [phone]  - Dr. Nicole Kindred: [phone]  In the event of inclement weather, please call our main line at [phone] for an update on the status of any delays or closures.  Dermatology Medication Tips: Please keep the boxes that topical medications come in in order to help keep track of the instructions about where and how to  use these. Pharmacies typically print the medication instructions only on the boxes and not directly on the medication tubes.   If your medication is too expensive, please contact our office at [phone] option 4 or send [place] a message through [place].   We are unable to tell what your co-pay for medications will be in advance as this is different depending on your insurance coverage. However, we may be able to find a substitute medication at lower cost or fill out paperwork to get insurance to cover a needed medication.   If a prior authorization is required to get your medication covered by your insurance company, please allow [place] 1-2 business days to complete this process.  Drug prices often vary depending on where the prescription is filled and some pharmacies may offer cheaper prices.  The website www.goodrx.com contains coupons for medications through different pharmacies. The prices here do not account for what the cost may be with help from insurance (it may be cheaper with your insurance), but the website can give you the price if you did not use any insurance.  - You can print the associated coupon and take it with your prescription to the pharmacy.  -  You may also stop by our office during regular business hours and pick up a GoodRx coupon card.  - If you need your prescription sent electronically to a different pharmacy, notify our office through [facility] or by phone at [phone] option 4.     Si Usted Necesita Algo Despus de Su Visita  Tambin puede enviarnos un mensaje a travs de Pharmacist, community. Por lo general respondemos a los mensajes de MyChart en el transcurso de 1 a 2 das hbiles.  Para renovar recetas, por favor pida a su farmacia que se ponga en contacto con nuestra oficina. Harland Dingwall de fax es [place] [phone].  Si tiene un asunto urgente cuando la clnica est cerrada y que no puede esperar hasta el siguiente da hbil, puede llamar/localizar a su  doctor(a) al nmero que aparece a continuacin.   Por favor, tenga en cuenta que aunque hacemos todo lo posible para estar disponibles para asuntos urgentes fuera del horario de [place], no estamos disponibles las 24 horas del da, los 7 das de la [place].   Si tiene un problema urgente y no puede comunicarse con nosotros, puede optar por buscar atencin mdica  en el consultorio de su doctor(a), en una clnica privada, en un centro de atencin urgente o en una sala de emergencias.  Si tiene Engineering geologist, por favor llame inmediatamente al 911 o vaya a la sala de emergencias.  Nmeros de bper  - Dr. Nehemiah Massed: [phone]  - Dra. Moye: [phone]  - Dra. Nicole Kindred: [phone]  En caso de inclemencias del [place], por favor llame a Johnsie Kindred principal al [phone] para una actualizacin sobre el [place] de cualquier retraso o cierre.  Consejos para la medicacin en dermatologa: Por favor, guarde las cajas en las que vienen los medicamentos de uso tpico para ayudarle a seguir las instrucciones sobre dnde y cmo usarlos. Las farmacias generalmente imprimen las instrucciones del medicamento slo en las cajas y no directamente en los tubos del [place].   Si su medicamento es muy caro, por favor, pngase en contacto con Zigmund Daniel llamando al [phone] y presione la opcin 4 o envenos un mensaje a travs de Pharmacist, community.   No podemos decirle cul ser su copago por los medicamentos por adelantado ya que esto es diferente dependiendo de la cobertura de su seguro. Sin embargo, es posible que podamos encontrar un medicamento sustituto a Electrical engineer un formulario para que el seguro cubra el medicamento que se considera necesario.   Si se requiere una autorizacin previa para que su compaa de seguros [place] su medicamento, por favor permtanos de 1 a 2 das hbiles para completar este proceso.  Los precios de los medicamentos varan con frecuencia dependiendo del  Environmental consultant de dnde se surte la receta y alguna farmacias pueden ofrecer precios ms baratos.  El sitio web www.goodrx.com tiene cupones para medicamentos de Airline pilot. Los precios aqu no tienen en cuenta lo que podra costar con la ayuda del seguro (puede ser ms barato con su seguro), pero el sitio web puede darle el precio si no utiliz Research scientist (physical sciences).  - Puede imprimir el cupn correspondiente y llevarlo con su receta a la farmacia.  - Tambin puede pasar por nuestra oficina durante el horario de atencin regular y Charity fundraiser una tarjeta de cupones de GoodRx.  - Si necesita que su receta se enve electrnicamente a Chiropodist, informe a nuestra oficina a travs de MyChart de [place] o por telfono llamando al [phone] y  presione la opcin 4.

## 2022-04-27 NOTE — Telephone Encounter (Signed)
Error

## 2022-05-03 ENCOUNTER — Encounter: Payer: Self-pay | Admitting: Dermatology

## 2022-05-30 ENCOUNTER — Other Ambulatory Visit: Payer: Self-pay

## 2022-05-30 MED ORDER — FLUOROURACIL 5 % EX CREA: TOPICAL_CREAM | Freq: Two times a day (BID) | CUTANEOUS | 1 refills | Status: DC

## 2022-05-30 NOTE — Telephone Encounter (Signed)
Patient called she never received her rx to treat precancers on her chest.   Ok Efudex/Calcipotriene cream [facility]'d [facility]

## 2022-06-24 ENCOUNTER — Encounter: Payer: Self-pay | Admitting: Dermatology

## 2022-10-27 ENCOUNTER — Ambulatory Visit: Payer: Medicare PPO | Admitting: Dermatology

## 2022-10-27 VITALS — BP 111/80 | HR 63

## 2022-10-27 DIAGNOSIS — X32XXXA Exposure to sunlight, initial encounter: Secondary | ICD-10-CM

## 2022-10-27 DIAGNOSIS — L821 Other seborrheic keratosis: Secondary | ICD-10-CM

## 2022-10-27 DIAGNOSIS — L680 Hirsutism: Secondary | ICD-10-CM

## 2022-10-27 DIAGNOSIS — W908XXA Exposure to other nonionizing radiation, initial encounter: Secondary | ICD-10-CM | POA: Diagnosis not present

## 2022-10-27 DIAGNOSIS — L578 Other skin changes due to chronic exposure to nonionizing radiation: Secondary | ICD-10-CM | POA: Diagnosis not present

## 2022-10-27 DIAGNOSIS — Z79899 Other long term (current) drug therapy: Secondary | ICD-10-CM

## 2022-10-27 DIAGNOSIS — L814 Other melanin hyperpigmentation: Secondary | ICD-10-CM

## 2022-10-27 DIAGNOSIS — L820 Inflamed seborrheic keratosis: Secondary | ICD-10-CM | POA: Diagnosis not present

## 2022-10-27 DIAGNOSIS — Z7189 Other specified counseling: Secondary | ICD-10-CM

## 2022-10-27 DIAGNOSIS — Z872 Personal history of diseases of the skin and subcutaneous tissue: Secondary | ICD-10-CM

## 2022-10-27 NOTE — Progress Notes (Signed)
Follow-Up Visit   Subjective  Karen Washington is a 67 y.o. female who presents for the following: 6 months f/u sun exposed areas, patient treated her chest with 5FU/Calcipotriene cream 6 months ago with a good response.  The patient has spots, moles and lesions to be evaluated, some may be new or changing and the patient may have concern these could be cancer.  The following portions of the chart were reviewed this encounter and updated as appropriate: medications, allergies, medical history  Review of Systems:  No other skin or systemic complaints except as noted in HPI or Assessment and Plan.  Objective  Well appearing patient in no apparent distress; mood and affect are within normal limits. A focused examination was performed of the following areas:face,chest,arms, hands Relevant exam findings are noted in the Assessment and Plan.  left chest x 1 Stuck-on, waxy, tan-brown papule--Discussed benign etiology and prognosis.   lip and chin Unwanted hairs    Assessment & Plan   Inflamed seborrheic keratosis left chest x 1 Symptomatic, irritating, patient would like treated.   Destruction of lesion - left chest x 1 Complexity: simple   Destruction method: cryotherapy   Informed consent: discussed and consent obtained   Timeout:  patient name, date of birth, surgical site, and procedure verified Lesion destroyed using liquid nitrogen: Yes   Region frozen until ice ball extended beyond lesion: Yes   Outcome: patient tolerated procedure well with no complications   Post-procedure details: wound care instructions given    Hirsutism lip and chin Discussed plucking, or shaving;  Vaniqa cream and BBL laser an option   Counseling for BBL / IPL / Laser and Coordination of Care Discussed the treatment option of Broad Band Light (BBL) /Intense Pulsed Light (IPL)/ Laser for skin discoloration, including brown spots and redness.  Typically we recommend at least 1-3 treatment sessions  about 5-8 weeks apart for best results.  Cannot have tanned skin when BBL performed, and regular use of sunscreen is advised after the procedure to help maintain results. The patient's condition may also require "maintenance treatments" in the future.  The fee for BBL / laser treatments is $350 per treatment session for the whole face.  A fee can be quoted for other parts of the body.  Insurance typically does not pay for BBL/laser treatments and therefore the fee is an out-of-pocket cost.   [facility] Eflornithine HCl Monohydrate 13.9% Cream apply to unwanted hairs qd-bid   ACTINIC DAMAGE - chronic, secondary to cumulative UV radiation exposure/sun exposure over time - diffuse scaly erythematous macules with underlying dyspigmentation - Recommend daily broad spectrum sunscreen SPF 30+ to sun-exposed areas, reapply every 2 hours as needed.  - Recommend staying in the shade or wearing long sleeves, sun glasses (UVA+UVB protection) and wide brim hats (4-inch brim around the entire circumference of the hat). - Call for new or changing lesions.   HISTORY OF ACTINIC KERATOSIS  Clear today- face,chest, hands  LENTIGINES Exam: scattered tan macules Due to sun exposure Treatment Plan: Benign-appearing, observe. Recommend daily broad spectrum sunscreen SPF 30+ to sun-exposed areas, reapply every 2 hours as needed.  Call for any changes  SEBORRHEIC KERATOSIS - Stuck-on, waxy, tan-brown papules and/or plaques  - Benign-appearing - Discussed benign etiology and prognosis. - Observe - Call for any changes  Return in about 6 months (around 04/28/2023) for Aks, TBSE .  Cipriano Bunker, CMA, am acting as scribe for Armida Sans, MD .   Documentation: I  have reviewed the above documentation for accuracy and completeness, and I agree with the above.  Armida Sans, MD

## 2022-10-27 NOTE — Patient Instructions (Addendum)
Counseling for BBL / IPL / Laser and Coordination of Care Discussed the treatment option of Broad Band Light (BBL) /Intense Pulsed Light (IPL)/ Laser for skin discoloration, including brown spots and redness.  Typically we recommend at least 1-3 treatment sessions about 5-8 weeks apart for best results.  Cannot have tanned skin when BBL performed, and regular use of sunscreen is advised after the procedure to help maintain results. The patient's condition may also require "maintenance treatments" in the future.  The fee for BBL / laser treatments is $350 per treatment session for the whole face.  A fee can be quoted for other parts of the body.  Insurance typically does not pay for BBL/laser treatments and therefore the fee is an out-of-pocket cost.     Instructions for [facility] Medications  One or more of your medications was sent to the [facility] mail order compounding pharmacy. You will receive an email from them and can purchase the medicine through that link. It will then be mailed to your home at the address you confirmed. If for any reason you do not receive an email from them, please check your spam folder. If you still do not find the email, please let us know. [facility] phone number is [phone].        Cryotherapy Aftercare  Wash gently with soap and water everyday.   Apply Vaseline and Band-Aid daily until healed.     Due to recent changes in healthcare laws, you may see results of your pathology and/or laboratory studies on MyChart before the doctors have had a chance to review them. We understand that in some cases there may be results that are confusing or concerning to you. Please understand that not all results are received at the same time and often the doctors may need to interpret multiple results in order to provide you with the best plan of care or course of treatment. Therefore, we ask that you please give [place] 2 business days to thoroughly review all  your results before contacting the office for clarification. Should we see a critical lab result, you will be contacted sooner.   If You Need Anything After Your Visit  If you have any questions or concerns for your doctor, please call our main line at [phone] and press option 4 to reach your doctor's medical assistant. If no one answers, please leave a voicemail as directed and we will return your call as soon as possible. Messages left after 4 pm will be answered the following business day.   You may also send [place] a message via MyChart. We typically respond to MyChart messages within 1-2 business days.  For prescription refills, please ask your pharmacy to contact our office. Our fax number is [phone].  If you have an urgent issue when the clinic is closed that cannot wait until the next business day, you can page your doctor at the number below.    Please note that while we do our best to be available for urgent issues outside of office hours, we are not available 24/7.   If you have an urgent issue and are unable to reach [place], you may choose to seek medical care at your doctor's office, retail clinic, urgent care center, or emergency room.  If you have a medical emergency, please immediately call 911 or go to the emergency department.  Pager Numbers  - Dr. Gwen Pounds: [phone]  - Dr. Neale Burly: [phone]  - Dr. Roseanne Reno: [phone]  In the event  of inclement weather, please call our main line at [phone] for an update on the status of any delays or closures.  Dermatology Medication Tips: Please keep the boxes that topical medications come in in order to help keep track of the instructions about where and how to use these. Pharmacies typically print the medication instructions only on the boxes and not directly on the medication tubes.   If your medication is too expensive, please contact our office at [phone] option 4 or send [place] a message through MyChart.   We  are unable to tell what your co-pay for medications will be in advance as this is different depending on your insurance coverage. However, we may be able to find a substitute medication at lower cost or fill out paperwork to get insurance to cover a needed medication.   If a prior authorization is required to get your medication covered by your insurance company, please allow [place] 1-2 business days to complete this process.  Drug prices often vary depending on where the prescription is filled and some pharmacies may offer cheaper prices.  The website www.goodrx.com contains coupons for medications through different pharmacies. The prices here do not account for what the cost may be with help from insurance (it may be cheaper with your insurance), but the website can give you the price if you did not use any insurance.  - You can print the associated coupon and take it with your prescription to the pharmacy.  - You may also stop by our office during regular business hours and pick up a GoodRx coupon card.  - If you need your prescription sent electronically to a different pharmacy, notify our office through [facility] or by phone at [phone] option 4.     Si Usted Necesita Algo Despus de Su Visita  Tambin puede enviarnos un mensaje a travs de Clinical cytogeneticist. Por lo general respondemos a los mensajes de MyChart en el transcurso de 1 a 2 das hbiles.  Para renovar recetas, por favor pida a su farmacia que se ponga en contacto con nuestra oficina. Annie Sable de fax es [place] [phone].  Si tiene un asunto urgente cuando la clnica est cerrada y que no puede esperar hasta el siguiente da hbil, puede llamar/localizar a su doctor(a) al nmero que aparece a continuacin.   Por favor, tenga en cuenta que aunque hacemos todo lo posible para estar disponibles para asuntos urgentes fuera del horario de [place], no estamos disponibles las 24 horas del da, los 7 [address]  [address] de la [place].   Si tiene  un problema urgente y no puede comunicarse con nosotros, puede optar por buscar atencin mdica  en el consultorio de su doctor(a), en una clnica privada, en un centro de atencin urgente o en una sala de emergencias.  Si tiene Engineer, drilling, por favor llame inmediatamente al 911 o vaya a la sala de emergencias.  Nmeros de bper  - Dr. Gwen Pounds: [phone]  - Dra. Moye: [phone]  - Dra. Roseanne Reno: [phone]  En caso de inclemencias del [place], por favor llame a Lacy Duverney principal al [phone] para una actualizacin sobre el [place] de cualquier retraso o cierre.  Consejos para la medicacin en dermatologa: Por favor, guarde las cajas en las que vienen los medicamentos de uso tpico para ayudarle a seguir las instrucciones sobre dnde y cmo usarlos. Las farmacias generalmente imprimen las instrucciones del medicamento slo en las cajas y no directamente en los tubos del [place].   Si su medicamento  es muy caro, por favor, pngase en contacto con Rolm Gala llamando al [phone] y presione la opcin 4 o envenos un mensaje a travs de Clinical cytogeneticist.   No podemos decirle cul ser su copago por los medicamentos por adelantado ya que esto es diferente dependiendo de la cobertura de su seguro. Sin embargo, es posible que podamos encontrar un medicamento sustituto a Audiological scientist un formulario para que el seguro cubra el medicamento que se considera necesario.   Si se requiere una autorizacin previa para que su compaa de seguros [place] su medicamento, por favor permtanos de 1 a 2 das hbiles para completar [address]  Los precios de los medicamentos varan con frecuencia dependiendo del Environmental consultant de dnde se surte la receta y alguna farmacias pueden ofrecer precios ms baratos.  El sitio web www.goodrx.com tiene cupones para medicamentos de Health and safety inspector. Los precios aqu no tienen en cuenta lo que podra costar con la ayuda del seguro (puede ser  ms barato con su seguro), pero el sitio web puede darle el precio si no utiliz Tourist information centre manager.  - Puede imprimir el cupn correspondiente y llevarlo con su receta a la farmacia.  - Tambin puede pasar por nuestra oficina durante el horario de atencin regular y Education officer, museum una tarjeta de cupones de GoodRx.  - Si necesita que su receta se enve electrnicamente a una farmacia diferente, informe a nuestra oficina a travs de MyChart de [place] o por telfono llamando al [phone] y presione la opcin 4.

## 2022-10-29 ENCOUNTER — Encounter: Payer: Self-pay | Admitting: Dermatology

## 2022-12-14 ENCOUNTER — Encounter (INDEPENDENT_AMBULATORY_CARE_PROVIDER_SITE_OTHER): Payer: Self-pay

## 2023-01-06 ENCOUNTER — Encounter: Payer: BC Managed Care – PPO | Admitting: Internal Medicine

## 2023-01-19 ENCOUNTER — Other Ambulatory Visit: Payer: Self-pay | Admitting: Internal Medicine

## 2023-01-19 DIAGNOSIS — Z1231 Encounter for screening mammogram for malignant neoplasm of breast: Secondary | ICD-10-CM

## 2023-01-27 ENCOUNTER — Encounter: Payer: Self-pay | Admitting: Internal Medicine

## 2023-01-27 DIAGNOSIS — Z207 Contact with and (suspected) exposure to pediculosis, acariasis and other infestations: Secondary | ICD-10-CM

## 2023-01-29 DIAGNOSIS — Z207 Contact with and (suspected) exposure to pediculosis, acariasis and other infestations: Secondary | ICD-10-CM

## 2023-01-29 HISTORY — DX: Contact with and (suspected) exposure to pediculosis, acariasis and other infestations: Z20.7

## 2023-01-29 MED ORDER — IVERMECTIN 3 MG PO TABS: 200.0000 ug/kg | ORAL_TABLET | Freq: Once | ORAL | 1 refills | Status: AC

## 2023-01-29 NOTE — Assessment & Plan Note (Signed)
Now with pruritic rash .

## 2023-01-30 ENCOUNTER — Telehealth: Payer: Self-pay

## 2023-01-30 NOTE — Telephone Encounter (Signed)
Karen Washington called from [facility] to state she needs a diagnosis code for ivermectin.  Karen Washington states she would like to have this information as soon as possible.

## 2023-01-30 NOTE — Telephone Encounter (Signed)
Diagnosis code has been given.

## 2023-02-20 ENCOUNTER — Encounter: Payer: Self-pay | Admitting: Internal Medicine

## 2023-02-20 ENCOUNTER — Ambulatory Visit (INDEPENDENT_AMBULATORY_CARE_PROVIDER_SITE_OTHER): Payer: Medicare PPO | Admitting: Internal Medicine

## 2023-02-20 VITALS — BP 108/70 | HR 79 | Temp 98.1°F | Ht 65.0 in | Wt 134.0 lb

## 2023-02-20 DIAGNOSIS — Z0000 Encounter for general adult medical examination without abnormal findings: Secondary | ICD-10-CM | POA: Diagnosis not present

## 2023-02-20 DIAGNOSIS — R7301 Impaired fasting glucose: Secondary | ICD-10-CM

## 2023-02-20 DIAGNOSIS — R5383 Other fatigue: Secondary | ICD-10-CM | POA: Diagnosis not present

## 2023-02-20 DIAGNOSIS — E785 Hyperlipidemia, unspecified: Secondary | ICD-10-CM | POA: Diagnosis not present

## 2023-02-20 DIAGNOSIS — Z23 Encounter for immunization: Secondary | ICD-10-CM

## 2023-02-20 DIAGNOSIS — I721 Aneurysm of artery of upper extremity: Secondary | ICD-10-CM

## 2023-02-20 DIAGNOSIS — Z1211 Encounter for screening for malignant neoplasm of colon: Secondary | ICD-10-CM

## 2023-02-20 DIAGNOSIS — R0683 Snoring: Secondary | ICD-10-CM | POA: Insufficient documentation

## 2023-02-20 DIAGNOSIS — Z853 Personal history of malignant neoplasm of breast: Secondary | ICD-10-CM

## 2023-02-20 DIAGNOSIS — Z13820 Encounter for screening for osteoporosis: Secondary | ICD-10-CM

## 2023-02-20 NOTE — Assessment & Plan Note (Signed)
REASSURED PATIENT THAT SHE HAS NO SIGNS OF SYMPTOMS OF OSA

## 2023-02-20 NOTE — Assessment & Plan Note (Addendum)
No recurrence since diagnosis in 2014.  Breast exam done and annual screening mammogram ordered

## 2023-02-20 NOTE — Patient Instructions (Addendum)
  You might want to try using Relaxium for insomnia  (as seen on TV commercials) . It is available through [employer] and contains all natural supplements:  Melatonin 5 mg  Chamomile 25 mg Passionflower extract 75 mg GABA 100 mg Ashwaganda extract 125 mg Magnesium citrate, glycinate, oxide (100 mg)  L tryptophan 500 mg Valerest (proprietary  ingredient ; probably valeria root extract)    MAMMOGRAM IS ordered and  ON TIME  DEXA is  DUE IN 2026 (every 5 years)   GI REFERRAL TO DR Mia Creek is in progress

## 2023-02-20 NOTE — Assessment & Plan Note (Signed)
NORMAL T SCORES BY 2021 DEXA.  REPEAT IN 2026

## 2023-02-20 NOTE — Assessment & Plan Note (Signed)
LEFT SHOULDER  NO FURTHER IMAGINAG

## 2023-02-20 NOTE — Assessment & Plan Note (Addendum)

## 2023-02-20 NOTE — Progress Notes (Signed)
Patient ID: Karen Washington, female    DOB: [birth year]  Age: 67 y.o. MRN: [id number]  The patient is here for annual preventive examination and management of other chronic and acute problems.   The risk factors are reflected in the social history.   The roster of all physicians providing medical care to patient - is listed in the Snapshot section of the chart.   Activities of daily living:  The patient is 100% independent in all ADLs: dressing, toileting, feeding as well as independent mobility   Home safety : The patient has smoke detectors in the home. They wear seatbelts.  There are no unsecured firearms at home. There is no violence in the home.    There is no risks for hepatitis, STDs or HIV. There is no   history of blood transfusion. They have no travel history to infectious disease endemic areas of the world.   The patient has seen their dentist in the last six month. They have seen their eye doctor in the last year. The patinet  denies slight hearing difficulty with regard to whispered voices and some television programs.  They have deferred audiologic testing in the last year.  They do not  have excessive sun exposure. Discussed the need for sun protection: hats, long sleeves and use of sunscreen if there is significant sun exposure.    Diet: the importance of a healthy diet is discussed. They do have a healthy diet.   The benefits of regular aerobic exercise were discussed. The patient  exercises  3 to 5 days per week  for  60 minutes.    Depression screen: there are no signs or vegative symptoms of depression- irritability, change in appetite, anhedonia, sadness/tearfullness.   The following portions of the patient's history were reviewed and updated as appropriate: allergies, current medications, past family history, past medical history,  past surgical history, past social history  and problem list.   Visual acuity was not assessed per patient preference since the patient has regular  follow up with an  ophthalmologist. Hearing and body mass index were assessed and reviewed.    During the course of the visit the patient was educated and counseled about appropriate screening and preventive services including : fall prevention , diabetes screening, nutrition counseling, colorectal cancer screening, and recommended immunizations.    Chief Complaint:  FULLY RECOVERED FROM  SCABIES (MOTHER HAD IT UNDIAGNOSED FOR 6 MONTHS IN A/L FACILITY) .  USED IVERMECTIN AND PERMETHRIN    INSOMNIA:  EARLY WAKEUPS.  TRIED ALPRAZOLA,M THEN GUMMIES. .  WORRIED ABOUT OSA.  WAKE HERSELF UP SNORING   Review of Symptoms  Patient denies headache, fevers, malaise, unintentional weight loss, skin rash, eye pain, sinus congestion and sinus pain, sore throat, dysphagia,  hemoptysis , cough, dyspnea, wheezing, chest pain, palpitations, orthopnea, edema, abdominal pain, nausea, melena, diarrhea, constipation, flank pain, dysuria, hematuria, urinary  Frequency, nocturia, numbness, tingling, seizures,  Focal weakness, Loss of consciousness,  Tremor, insomnia, depression, anxiety, and suicidal ideation.    Physical Exam:  BP 108/70   Pulse 79   Temp 98.1 F (36.7 C) (Oral)   Ht 5\' 5"  (1.651 m)   Wt 134 lb (60.8 kg)   SpO2 96%   BMI 22.30 kg/m    Physical Exam Vitals reviewed.  Constitutional:      General: She is not in acute distress.    Appearance: Normal appearance. She is well-developed and normal weight. She is not ill-appearing, toxic-appearing or diaphoretic.  HENT:  Head: Normocephalic.     Right Ear: Tympanic membrane, ear canal and external ear normal. There is no impacted cerumen.     Left Ear: Tympanic membrane, ear canal and external ear normal. There is no impacted cerumen.     Nose: Nose normal.     Mouth/Throat:     Mouth: Mucous membranes are moist.     Pharynx: Oropharynx is clear.  Eyes:     General: No scleral icterus.       Right eye: No discharge.        Left eye: No  discharge.     Conjunctiva/sclera: Conjunctivae normal.     Pupils: Pupils are equal, round, and reactive to light.  Neck:     Thyroid: No thyromegaly.     Vascular: No carotid bruit or JVD.  Cardiovascular:     Rate and Rhythm: Normal rate and regular rhythm.     Heart sounds: Normal heart sounds.  Pulmonary:     Effort: Pulmonary effort is normal. No respiratory distress.     Breath sounds: Normal breath sounds.  Chest:  Breasts:    Breasts are symmetrical.     Right: Normal. No swelling, inverted nipple, mass, nipple discharge, skin change or tenderness.     Left: Normal. No swelling, inverted nipple, mass, nipple discharge, skin change or tenderness.  Abdominal:     General: Bowel sounds are normal.     Palpations: Abdomen is soft. There is no mass.     Tenderness: There is no abdominal tenderness. There is no guarding or rebound.  Musculoskeletal:        General: Normal range of motion.     Cervical back: Normal range of motion and neck supple.  Lymphadenopathy:     Cervical: No cervical adenopathy.     Upper Body:     Right upper body: No supraclavicular, axillary or pectoral adenopathy.     Left upper body: No supraclavicular, axillary or pectoral adenopathy.  Skin:    General: Skin is warm and dry.  Neurological:     General: No focal deficit present.     Mental Status: She is alert and oriented to person, place, and time. Mental status is at baseline.  Psychiatric:        Mood and Affect: Mood normal.        Behavior: Behavior normal.        Thought Content: Thought content normal.        Judgment: Judgment normal.     Assessment and Plan: Hyperlipidemia, unspecified hyperlipidemia type -     Lipid panel -     LDL cholesterol, direct  Impaired fasting glucose -     Comprehensive metabolic panel -     Hemoglobin A1c  Other fatigue -     CBC with Differential/Platelet -     TSH  Colon cancer screening -     Ambulatory referral to  Gastroenterology  History of breast cancer Assessment & Plan: No recurrence since diagnosis in 2014.  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  REPEAT IN 2026    Snoring Assessment & Plan: REASSURED PATIENT THAT SHE  HAS NO SIGNS OF SYMPTOMS OF OSA    Encounter for preventive health examination Assessment & Plan: age appropriate education and counseling updated, referrals for preventative services and immunizations addressed, dietary and smoking counseling addressed, most recent labs reviewed.  I have personally reviewed and have noted:   1) the patient's medical and social history 2) The pt's use of alcohol, tobacco, and illicit drugs 3) The patient's current medications and supplements 4) Functional ability including ADL's, fall risk, home safety risk, hearing and visual impairment 5) Diet and physical activities 6) Evidence for depression or mood disorder 7) The patient's height, weight, and BMI have been recorded in the chart  I have made referrals, and provided counseling and education based on review of the above      No follow-ups on file.  Sherlene Shams, MD

## 2023-02-21 LAB — LIPID PANEL
Cholesterol: 191 mg/dL (ref 0–200)
HDL: 90.5 mg/dL (ref >39.00)
LDL Cholesterol: 90 mg/dL (ref 0–99)
NonHDL: 100.35
Total CHOL/HDL Ratio: 2
Triglycerides: 53 mg/dL (ref 0.0–149.0)
VLDL: 10.6 mg/dL (ref 0.0–40.0)

## 2023-02-21 LAB — COMPREHENSIVE METABOLIC PANEL
ALT: 14 U/L (ref 0–35)
AST: 18 U/L (ref 0–37)
Albumin: 4.4 g/dL (ref 3.5–5.2)
Alkaline Phosphatase: 63 U/L (ref 39–117)
BUN: 15 mg/dL (ref 6–23)
CO2: 29 meq/L (ref 19–32)
Calcium: 9.6 mg/dL (ref 8.4–10.5)
Chloride: 101 meq/L (ref 96–112)
Creatinine, Ser: 0.99 mg/dL (ref 0.40–1.20)
GFR: 59.9 mL/min — ABNORMAL LOW (ref >60.00)
Glucose, Bld: 87 mg/dL (ref 70–99)
Potassium: 4.2 meq/L (ref 3.5–5.1)
Sodium: 139 meq/L (ref 135–145)
Total Bilirubin: 0.7 mg/dL (ref 0.2–1.2)
Total Protein: 7 g/dL (ref 6.0–8.3)

## 2023-02-21 LAB — CBC WITH DIFFERENTIAL/PLATELET
Basophils Absolute: 0.1 10*3/uL (ref 0.0–0.1)
Basophils Relative: 0.9 % (ref 0.0–3.0)
Eosinophils Absolute: 0.1 10*3/uL (ref 0.0–0.7)
Eosinophils Relative: 0.7 % (ref 0.0–5.0)
HCT: 41.8 % (ref 36.0–46.0)
Hemoglobin: 13.6 g/dL (ref 12.0–15.0)
Lymphocytes Relative: 15.5 % (ref 12.0–46.0)
Lymphs Abs: 1.5 10*3/uL (ref 0.7–4.0)
MCHC: 32.6 g/dL (ref 30.0–36.0)
MCV: 94.1 fL (ref 78.0–100.0)
Monocytes Absolute: 0.9 10*3/uL (ref 0.1–1.0)
Monocytes Relative: 9.3 % (ref 3.0–12.0)
Neutro Abs: 7.3 10*3/uL (ref 1.4–7.7)
Neutrophils Relative %: 73.6 % (ref 43.0–77.0)
Platelets: 288 10*3/uL (ref 150.0–400.0)
RBC: 4.44 Mil/uL (ref 3.87–5.11)
RDW: 12.8 % (ref 11.5–15.5)
WBC: 9.8 10*3/uL (ref 4.0–10.5)

## 2023-02-21 LAB — LDL CHOLESTEROL, DIRECT: Direct LDL: 88 mg/dL

## 2023-02-21 LAB — HEMOGLOBIN A1C: Hgb A1c MFr Bld: 5.8 % (ref 4.6–6.5)

## 2023-02-21 LAB — CANCER ANTIGEN 27.29: CA 27.29: 14 U/mL (ref <38)

## 2023-02-21 LAB — TSH: TSH: 1.01 u[IU]/mL (ref 0.35–5.50)

## 2023-03-06 ENCOUNTER — Ambulatory Visit
Admission: RE | Admit: 2023-03-06 | Discharge: 2023-03-06 | Disposition: A | Discharge status: Home / Self Care | Payer: Medicare PPO | Source: Ambulatory Visit | Attending: Internal Medicine | Admitting: Internal Medicine

## 2023-03-06 DIAGNOSIS — Z1231 Encounter for screening mammogram for malignant neoplasm of breast: Secondary | ICD-10-CM | POA: Diagnosis present

## 2023-04-27 ENCOUNTER — Ambulatory Visit: Payer: Medicare PPO | Admitting: Dermatology

## 2023-04-27 ENCOUNTER — Encounter: Payer: Self-pay | Admitting: Dermatology

## 2023-04-27 DIAGNOSIS — Z79899 Other long term (current) drug therapy: Secondary | ICD-10-CM

## 2023-04-27 DIAGNOSIS — L03112 Cellulitis of left axilla: Secondary | ICD-10-CM

## 2023-04-27 DIAGNOSIS — L03111 Cellulitis of right axilla: Secondary | ICD-10-CM | POA: Diagnosis not present

## 2023-04-27 DIAGNOSIS — L03319 Cellulitis of trunk, unspecified: Secondary | ICD-10-CM

## 2023-04-27 DIAGNOSIS — Z7189 Other specified counseling: Secondary | ICD-10-CM | POA: Diagnosis not present

## 2023-04-27 DIAGNOSIS — L02422 Furuncle of left axilla: Secondary | ICD-10-CM | POA: Diagnosis not present

## 2023-04-27 DIAGNOSIS — B86 Scabies: Secondary | ICD-10-CM | POA: Diagnosis not present

## 2023-04-27 DIAGNOSIS — L02421 Furuncle of right axilla: Secondary | ICD-10-CM

## 2023-04-27 DIAGNOSIS — L739 Follicular disorder, unspecified: Secondary | ICD-10-CM

## 2023-04-27 DIAGNOSIS — Z207 Contact with and (suspected) exposure to pediculosis, acariasis and other infestations: Secondary | ICD-10-CM

## 2023-04-27 MED ORDER — IVERMECTIN 3 MG PO TABS: ORAL_TABLET | ORAL | 0 refills | Status: DC

## 2023-04-27 MED ORDER — MUPIROCIN 2 % EX OINT: 1.0000 | TOPICAL_OINTMENT | CUTANEOUS | 2 refills | Status: DC

## 2023-04-27 MED ORDER — PERMETHRIN 5 % EX CREA: 1.0000 | TOPICAL_CREAM | CUTANEOUS | 1 refills | Status: DC

## 2023-04-27 MED ORDER — DOXYCYCLINE MONOHYDRATE 100 MG PO CAPS: 100.0000 mg | ORAL_CAPSULE | Freq: Two times a day (BID) | ORAL | 0 refills | Status: AC

## 2023-04-27 NOTE — Progress Notes (Signed)
   Follow-Up Visit   Subjective  Karen Washington is a 67 y.o. female who presents for the following: boils under axilla 2.5 months, red, hot feeling, some have burst, tried otc Cortisone cream, Rash, legs back, Hx of scabies in past, txted with permethrin cream and oral Ivermectin  The patient has spots, moles and lesions to be evaluated, some may be new or changing and the patient may have concern these could be cancer.   The following portions of the chart were reviewed this encounter and updated as appropriate: medications, allergies, medical history  Review of Systems:  No other skin or systemic complaints except as noted in HPI or Assessment and Plan.  Objective  Well appearing patient in no apparent distress; mood and affect are within normal limits.   A focused examination was performed of the following areas: Axilla, back, legs  Relevant exam findings are noted in the Assessment and Plan.    Assessment & Plan   SCABIES body Exam:  Discussed highly contagious condition.  All household members/close contacts should be treated at same time.  After treatment with topical Elimite cream applied as directed overnight (and/or oral ivermectin), wash all bed sheets, towels, and recently worn clothing in hot water, and dry on high heat.  Repeat entire process in one week.  Treatment Plan: Restart Permetherin cream from chin to toes in skin folds leave on overnight and wash off in the morning, repeat in 1 week Start Ivermectin 3mg  5 po hs then repeat in 1 week  Same medications sent in for patients husband Sander Stogner 07/18/1954   FURUNCULOSIS  Cellulitis Likely Staphylococcus Bil axilla Exam: red  tender firm nodules bil axilla Treatment Plan: Start Mupirocin oint bid to axilla, qhs to nostrils for 10 days then after 10 days of in the nostrils use hs on weekends to nostrils Start Doxycycline 100mg  1 po bid x 10 days, take with food and drink  Doxycycline should be taken with  food to prevent nausea. Do not lay down for 30 minutes after taking. Be cautious with sun exposure and use good sun protection while on this medication. Pregnant women should not take this medication.     Return for To be secheduled for TBSE.  I, Ardis Rowan, RMA, am acting as scribe for Armida Sans, MD .   Documentation: I have reviewed the above documentation for accuracy and completeness, and I agree with the above.  Armida Sans, MD

## 2023-04-27 NOTE — Patient Instructions (Addendum)
Restart Permetherin cream from chin to toes in skin folds leave on overnight and wash off in the morning, repeat in 1 week Start Ivemectin 3mg  5 pill by mouth in the evening then repeat in 1 week  For Under arms Start Mupirocin oint twice a day to axilla until resolved, nightly to nostrils for 10 days then after 10 days of in the nostrils use nightly on weekends to nostrils Start Doxycycline 100mg  1 pill by mouth twice a day for 10 days, take with food and drink  Due to recent changes in healthcare laws, you may see results of your pathology and/or laboratory studies on MyChart before the doctors have had a chance to review them. We understand that in some cases there may be results that are confusing or concerning to you. Please understand that not all results are received at the same time and often the doctors may need to interpret multiple results in order to provide you with the best plan of care or course of treatment. Therefore, we ask that you please give [place] 2 business days to thoroughly review all your results before contacting the office for clarification. Should we see a critical lab result, you will be contacted sooner.   If You Need Anything After Your Visit  If you have any questions or concerns for your doctor, please call our main line at [phone] and press option 4 to reach your doctor's medical assistant. If no one answers, please leave a voicemail as directed and we will return your call as soon as possible. Messages left after 4 pm will be answered the following business day.   You may also send [place] a message via MyChart. We typically respond to MyChart messages within 1-2 business days.  For prescription refills, please ask your pharmacy to contact our office. Our fax number is [phone].  If you have an urgent issue when the clinic is closed that cannot wait until the next business day, you can page your doctor at the number below.    Please note that while we do our  best to be available for urgent issues outside of office hours, we are not available 24/7.   If you have an urgent issue and are unable to reach [place], you may choose to seek medical care at your doctor's office, retail clinic, urgent care center, or emergency room.  If you have a medical emergency, please immediately call 911 or go to the emergency department.  Pager Numbers  - Dr. Gwen Pounds: [phone]  - Dr. Roseanne Reno: [phone]  - Dr. Katrinka Blazing: [phone]   In the event of inclement weather, please call our main line at [phone] for an update on the status of any delays or closures.  Dermatology Medication Tips: Please keep the boxes that topical medications come in in order to help keep track of the instructions about where and how to use these. Pharmacies typically print the medication instructions only on the boxes and not directly on the medication tubes.   If your medication is too expensive, please contact our office at [phone] option 4 or send [place] a message through MyChart.   We are unable to tell what your co-pay for medications will be in advance as this is different depending on your insurance coverage. However, we may be able to find a substitute medication at lower cost or fill out paperwork to get insurance to cover a needed medication.   If a prior authorization is required to get your medication covered by your  insurance company, please allow [place] 1-2 business days to complete this process.  Drug prices often vary depending on where the prescription is filled and some pharmacies may offer cheaper prices.  The website www.goodrx.com contains coupons for medications through different pharmacies. The prices here do not account for what the cost may be with help from insurance (it may be cheaper with your insurance), but the website can give you the price if you did not use any insurance.  - You can print the associated coupon and take it with your prescription to the  pharmacy.  - You may also stop by our office during regular business hours and pick up a GoodRx coupon card.  - If you need your prescription sent electronically to a different pharmacy, notify our office through [facility] or by phone at [phone] option 4.     Si Usted Necesita Algo Despus de Su Visita  Tambin puede enviarnos un mensaje a travs de Clinical cytogeneticist. Por lo general respondemos a los mensajes de MyChart en el transcurso de 1 a 2 das hbiles.  Para renovar recetas, por favor pida a su farmacia que se ponga en contacto con nuestra oficina. Annie Sable de fax es [place] [phone].  Si tiene un asunto urgente cuando la clnica est cerrada y que no puede esperar hasta el siguiente da hbil, puede llamar/localizar a su doctor(a) al nmero que aparece a continuacin.   Por favor, tenga en cuenta que aunque hacemos todo lo posible para estar disponibles para asuntos urgentes fuera del horario de [place], no estamos disponibles las 24 horas del da, los 7 [address]  [address] de la [place].   Si tiene un problema urgente y no puede comunicarse con nosotros, puede optar por buscar atencin mdica  en el consultorio de su doctor(a), en una clnica privada, en un centro de atencin urgente o en una sala de emergencias.  Si tiene Engineer, drilling, por favor llame inmediatamente al 911 o vaya a la sala de emergencias.  Nmeros de bper  - Dr. Gwen Pounds: [phone]  - Dra. Roseanne Reno: [phone]  - Dr. Katrinka Blazing: [phone]   En caso de inclemencias del tiempo, por favor llame a Lacy Duverney principal al [phone] para una actualizacin sobre el [place] de cualquier retraso o cierre.  Consejos para la medicacin en dermatologa: Por favor, guarde las cajas en las que vienen los medicamentos de uso tpico para ayudarle a seguir las instrucciones sobre dnde y cmo usarlos. Las farmacias generalmente imprimen las instrucciones del medicamento slo en las cajas y no directamente en los  tubos del [place].   Si su medicamento es muy caro, por favor, pngase en contacto con Rolm Gala llamando al [phone] y presione la opcin 4 o envenos un mensaje a travs de Clinical cytogeneticist.   No podemos decirle cul ser su copago por los medicamentos por adelantado ya que esto es diferente dependiendo de la cobertura de su seguro. Sin embargo, es posible que podamos encontrar un medicamento sustituto a Audiological scientist un formulario para que el seguro cubra el medicamento que se considera necesario.   Si se requiere una autorizacin previa para que su compaa de seguros [place] su medicamento, por favor permtanos de 1 a 2 das hbiles para completar [address]  Los precios de los medicamentos varan con frecuencia dependiendo del Environmental consultant de dnde se surte la receta y alguna farmacias pueden ofrecer precios ms baratos.  El sitio web www.goodrx.com tiene cupones para medicamentos de Health and safety inspector. Los precios aqu no tienen  en cuenta lo que podra costar con la ayuda del seguro (puede ser ms barato con su seguro), pero el sitio web puede darle el precio si no Visual merchandiser.  - Puede imprimir el cupn correspondiente y llevarlo con su receta a la farmacia.  - Tambin puede pasar por nuestra oficina durante el horario de atencin regular y Education officer, museum una tarjeta de cupones de GoodRx.  - Si necesita que su receta se enve electrnicamente a una farmacia diferente, informe a nuestra oficina a travs de MyChart de [place] o por telfono llamando al [phone] y presione la opcin 4.

## 2023-05-02 ENCOUNTER — Encounter: Payer: Self-pay | Admitting: Dermatology

## 2023-05-25 ENCOUNTER — Ambulatory Visit: Payer: Medicare PPO

## 2023-05-25 DIAGNOSIS — Z860100 Personal history of colon polyps, unspecified: Secondary | ICD-10-CM | POA: Diagnosis not present

## 2023-05-25 DIAGNOSIS — Z09 Encounter for follow-up examination after completed treatment for conditions other than malignant neoplasm: Secondary | ICD-10-CM | POA: Diagnosis not present

## 2023-05-25 DIAGNOSIS — K5730 Diverticulosis of large intestine without perforation or abscess without bleeding: Secondary | ICD-10-CM | POA: Diagnosis not present

## 2023-05-27 ENCOUNTER — Encounter: Payer: Self-pay | Admitting: Dermatology

## 2023-05-29 MED ORDER — PERMETHRIN 5 % EX CREA: 1.0000 | TOPICAL_CREAM | CUTANEOUS | 1 refills | Status: DC

## 2023-06-02 ENCOUNTER — Other Ambulatory Visit: Payer: Self-pay | Admitting: Dermatology

## 2023-06-05 ENCOUNTER — Telehealth: Payer: Self-pay

## 2023-06-05 ENCOUNTER — Other Ambulatory Visit: Payer: Self-pay

## 2023-06-05 MED ORDER — IVERMECTIN 3 MG PO TABS: ORAL_TABLET | ORAL | 1 refills | Status: DC

## 2023-06-05 MED ORDER — PERMETHRIN 5 % EX CREA: 1.0000 | TOPICAL_CREAM | CUTANEOUS | 1 refills | Status: DC

## 2023-06-05 NOTE — Telephone Encounter (Signed)
I talk to this patient to give her an update on why her Ivermectin and Elimite cream was not sent last week, [facility]

## 2023-06-05 NOTE — Telephone Encounter (Signed)
Rx sent for Ivermectin 3 mg take 5 tablets today, repeat in 1 weeks  Rx sent for Elimite cream

## 2023-07-19 ENCOUNTER — Ambulatory Visit: Payer: Medicare PPO | Admitting: Dermatology

## 2023-08-28 DIAGNOSIS — S52124A Nondisplaced fracture of head of right radius, initial encounter for closed fracture: Secondary | ICD-10-CM | POA: Diagnosis not present

## 2023-09-08 DIAGNOSIS — S52124A Nondisplaced fracture of head of right radius, initial encounter for closed fracture: Secondary | ICD-10-CM | POA: Diagnosis not present

## 2023-09-19 ENCOUNTER — Ambulatory Visit: Payer: Medicare PPO | Admitting: Dermatology

## 2023-09-19 ENCOUNTER — Encounter: Payer: Self-pay | Admitting: Dermatology

## 2023-09-19 DIAGNOSIS — L570 Actinic keratosis: Secondary | ICD-10-CM

## 2023-09-19 DIAGNOSIS — L814 Other melanin hyperpigmentation: Secondary | ICD-10-CM

## 2023-09-19 DIAGNOSIS — L578 Other skin changes due to chronic exposure to nonionizing radiation: Secondary | ICD-10-CM

## 2023-09-19 DIAGNOSIS — Z872 Personal history of diseases of the skin and subcutaneous tissue: Secondary | ICD-10-CM

## 2023-09-19 DIAGNOSIS — L821 Other seborrheic keratosis: Secondary | ICD-10-CM

## 2023-09-19 DIAGNOSIS — D1801 Hemangioma of skin and subcutaneous tissue: Secondary | ICD-10-CM

## 2023-09-19 DIAGNOSIS — W908XXA Exposure to other nonionizing radiation, initial encounter: Secondary | ICD-10-CM | POA: Diagnosis not present

## 2023-09-19 DIAGNOSIS — Z853 Personal history of malignant neoplasm of breast: Secondary | ICD-10-CM

## 2023-09-19 DIAGNOSIS — D229 Melanocytic nevi, unspecified: Secondary | ICD-10-CM

## 2023-09-19 DIAGNOSIS — Z1283 Encounter for screening for malignant neoplasm of skin: Secondary | ICD-10-CM

## 2023-09-19 NOTE — Progress Notes (Signed)
   Follow-Up Visit   Subjective  Karen Washington is a 68 y.o. female who presents for the following: Skin Cancer Screening and Full Body Skin Exam Hx of precancers.   The patient presents for Total-Body Skin Exam (TBSE) for skin cancer screening and mole check. The patient has spots, moles and lesions to be evaluated, some may be new or changing and the patient may have concern these could be cancer.  The following portions of the chart were reviewed this encounter and updated as appropriate: medications, allergies, medical history  Review of Systems:  No other skin or systemic complaints except as noted in HPI or Assessment and Plan.  Objective  Well appearing patient in no apparent distress; mood and affect are within normal limits.  A full examination was performed including scalp, head, eyes, ears, nose, lips, neck, chest, axillae, abdomen, back, buttocks, bilateral upper extremities, bilateral lower extremities, hands, feet, fingers, toes, fingernails, and toenails. All findings within normal limits unless otherwise noted below.   Relevant physical exam findings are noted in the Assessment and Plan.  right temple x 2, chest x 2 (4) Erythematous thin papules/macules with gritty scale.      Assessment & Plan   SKIN CANCER SCREENING PERFORMED TODAY.  LENTIGINES, SEBORRHEIC KERATOSES, Benign normal skin lesions - Benign-appearing - Call for any changes  MELANOCYTIC NEVI - Tan-brown and/or pink-flesh-colored symmetric macules and papules - Benign appearing on exam today - Observation - Call clinic for new or changing moles - Recommend daily use of broad spectrum spf 30+ sunscreen to sun-exposed areas.   HISTORY OF BREAST CANCER  Right breast  No lymphadenopathy   HEMANGIOMA Mid to low back  See photo Exam: red papule.  Dermatoscopic exam consistent with hemangioma today. Photo taken  Discussed benign nature. Recommend observation. Call for changes.   AK (ACTINIC  KERATOSIS) (4) right temple x 2, chest x 2 (4) ACTINIC DAMAGE - chronic, secondary to cumulative UV radiation exposure/sun exposure over time - diffuse scaly erythematous macules with underlying dyspigmentation - Recommend daily broad spectrum sunscreen SPF 30+ to sun-exposed areas, reapply every 2 hours as needed.  - Recommend staying in the shade or wearing long sleeves, sun glasses (UVA+UVB protection) and wide brim hats (4-inch brim around the entire circumference of the hat). - Call for new or changing lesions.  Destruction of lesion - right temple x 2, chest x 2 (4) Complexity: simple   Destruction method: cryotherapy   Informed consent: discussed and consent obtained   Timeout:  patient name, date of birth, surgical site, and procedure verified Lesion destroyed using liquid nitrogen: Yes   Region frozen until ice ball extended beyond lesion: Yes   Outcome: patient tolerated procedure well with no complications   Post-procedure details: wound care instructions given   SKIN CANCER SCREENING   ACTINIC SKIN DAMAGE   MELANOCYTIC NEVUS, UNSPECIFIED LOCATION   LENTIGO   HISTORY OF BREAST CANCER   HEMANGIOMA OF SKIN     Return in about 1 year (around 09/18/2024) for TBSE, hx of AKs .  IClara Crisp, CMA, am acting as scribe for Celine Collard, MD .   Documentation: I have reviewed the above documentation for accuracy and completeness, and I agree with the above.  Celine Collard, MD

## 2023-09-19 NOTE — Patient Instructions (Addendum)

## 2023-09-22 DIAGNOSIS — S52124A Nondisplaced fracture of head of right radius, initial encounter for closed fracture: Secondary | ICD-10-CM | POA: Diagnosis not present

## 2023-09-22 DIAGNOSIS — S52131S Displaced fracture of neck of right radius, sequela: Secondary | ICD-10-CM | POA: Diagnosis not present

## 2023-09-26 ENCOUNTER — Ambulatory Visit: Payer: Medicare PPO | Admitting: Dermatology

## 2023-10-06 DIAGNOSIS — S52124A Nondisplaced fracture of head of right radius, initial encounter for closed fracture: Secondary | ICD-10-CM | POA: Diagnosis not present

## 2023-10-06 DIAGNOSIS — S53011D Anterior subluxation of right radial head, subsequent encounter: Secondary | ICD-10-CM | POA: Diagnosis not present

## 2023-10-17 ENCOUNTER — Ambulatory Visit: Admitting: *Deleted

## 2023-10-17 VITALS — Ht 65.0 in | Wt 138.0 lb

## 2023-10-17 DIAGNOSIS — Z0000 Encounter for general adult medical examination without abnormal findings: Secondary | ICD-10-CM

## 2023-10-17 NOTE — Progress Notes (Signed)
 Subjective:   Karen Washington is a 68 y.o. who presents for a Medicare Wellness preventive visit.  As a reminder, Annual Wellness Visits don't include a physical exam, and some assessments may be limited, especially if this visit is performed virtually. We may recommend an in-person follow-up visit with your provider if needed.  Visit Complete: Virtual I connected with  [name] on 10/17/23 by a video and audio enabled telemedicine application and verified that I am speaking with the correct person using two identifiers.  Patient Location: Home  Provider Location: Home Office  I discussed the limitations of evaluation and management by telemedicine. The patient expressed understanding and agreed to proceed.  Vital Signs: Because this visit was a virtual/telehealth visit, some criteria may be missing or patient reported. Any vitals not documented were not able to be obtained and vitals that have been documented are patient reported.    Persons Participating in Visit: Patient.  AWV Questionnaire: No: Patient Medicare AWV questionnaire was not completed prior to this visit.  Cardiac Risk Factors include: advanced age (>91men, >38 women)     Objective:     Today's Vitals   10/17/23 1338  Weight: 138 lb (62.6 kg)  Height: 5\' 5"  (1.651 m)   Body mass index is 22.96 kg/m.     10/17/2023    1:52 PM 03/16/2017   11:02 AM 02/27/2017   10:06 AM 03/16/2016    3:36 PM 03/07/2016   10:26 AM 03/06/2015   11:30 AM 03/06/2015   11:28 AM  Advanced Directives  Does Patient Have a Medical Advance Directive? Yes Yes Yes Yes Yes Yes No  Type of Estate agent of [place];Living will Healthcare Power of [place];Living will Healthcare Power of [place];Living will Out of facility DNR (pink MOST or yellow form);Living will Healthcare Power of [place];Living will    Copy of Healthcare Power of Attorney in Chart? No - copy requested Yes  No - copy requested     Would  patient like information on creating a medical advance directive?    No - patient declined information       Current Medications (verified) Outpatient Encounter Medications as of 10/17/2023  Medication Sig   ALPRAZolam  (XANAX ) 0.25 MG tablet Take 1 tablet (0.25 mg total) by mouth at bedtime as needed. for sleep   cholecalciferol (VITAMIN D ) 1000 units tablet Take 1,000 Units by mouth daily.   naproxen sodium (ALEVE) 220 MG tablet Take 220 mg by mouth daily as needed.   [DISCONTINUED] fluorouracil  (EFUDEX ) 5 % cream Apply topically 2 (two) times daily. Apply to affected areas on the chest twice a day x 7 days   [DISCONTINUED] ivermectin  (STROMECTOL ) 3 MG TABS tablet Take 5 tablets po hs and repeat in 1 week   [DISCONTINUED] ivermectin  (STROMECTOL ) 3 MG TABS tablet Take as directed   [DISCONTINUED] mupirocin  ointment (BACTROBAN ) 2 % Apply 1 Application topically as directed. Bid to axilla until resolved, qhs to both nostrils for 10 days then after 10 days on weekends at bedtime to nostrils   [DISCONTINUED] permethrin  (ELIMITE ) 5 % cream Apply 1 Application topically as directed. Apply from chin to toes and in body folds leave on overnight and wash off in the morning, repeat in 1 week   No facility-administered encounter medications on file as of 10/17/2023.    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 Whole breast radiation.   Adjuvant chemotherapy with Docetaxel/Carboplatin/Herceptin starting on 01/15/13, completed 6 cycles of Docetaxel/Carboplatin on 04/29/13.  Completed 52 weeks of Herceptin on 01/06/14.   Personal history of chemotherapy    Personal history of radiation therapy    Shingles 2014   Past Surgical History:  Procedure Laterality Date    AUGMENTATION MAMMAPLASTY Left 2014   no implant, mastopexy done   BREAST BIOPSY Right 11/22/2012   INVASIVE MAMMARY CARCINOMA, NO SPECIAL TYPE, DCIS   BREAST BIOPSY Right 02/17/2021   stereo bx of calcs, x marker,BENIGN BREAST TISSUE WITH DENSE STROMAL FIBROSIS,   BREAST LUMPECTOMY Right 12/13/2012   invasive mammary carcinoma and DCIS. Clear margins   BREAST SURGERY Right 12/13/2012   lumpectomy   COLONOSCOPY  2011   MASTOPEXY Left 03/2014   Dr Germaine Kohut   [facility] REMOVAL  02/05/2014   Dr Marquita Situ   PORTACATH PLACEMENT  2014   Family History  Problem Relation Age of Onset   Cancer Mother        uterine    Breast cancer Maternal Grandmother    Cancer Father        prostate   Social History   Socioeconomic History   Marital status: Married    Spouse name: Not on file   Number of children: Not on file   Years of education: Not on file   Highest education level: Not on file  Occupational History   Not on file  Tobacco Use   Smoking status: Never   Smokeless tobacco: Never  Substance and Sexual Activity   Alcohol use: Yes   Drug use: No   Sexual activity: Not on file  Other Topics Concern   Not on file  Social History Narrative   Not on file   Social Drivers of Health   Financial Resource Strain: Low Risk  (10/17/2023)   Overall Financial Resource Strain (CARDIA)    Difficulty of Paying Living Expenses: Not hard at all  Food Insecurity: No Food Insecurity (10/17/2023)   Hunger Vital Sign    Worried About Running Out of Food in the Last Year: Never true    Ran Out of Food in the Last Year: Never true  Transportation Needs: No Transportation Needs (10/17/2023)   PRAPARE - Administrator, Civil Service (Medical): No    Lack of Transportation (Non-Medical): No  Physical Activity: Sufficiently Active (10/17/2023)   Exercise Vital Sign    Days of Exercise per Week: 7 days    Minutes of Exercise per Session: 70 min  Stress: No Stress Concern Present (10/17/2023)    [employer] of Occupational Health - Occupational Stress Questionnaire    Feeling of Stress : Not at all  Social Connections: Socially Integrated (10/17/2023)   Social Connection and Isolation Panel [NHANES]    Frequency of Communication with Friends and Family: More than three times a week    Frequency of Social Gatherings with Friends and Family: More than three times a week    Attends Religious Services: More than 4 times per year    Active Member of [employer] or Organizations: Yes    Attends Engineer, structural: More than 4 times per year    Marital Status: Married    Tobacco Counseling Counseling given: Not Answered    Clinical Intake:  Consulting civil engineer  completed: Yes  Pain : No/denies pain     BMI - recorded: 22.96 Nutritional Status: BMI of 19-24  Normal Nutritional Risks: None Diabetes: No  Lab Results  Component Value Date   HGBA1C 5.8 02/20/2023   HGBA1C 6.0 01/03/2022     How often do you need to have someone help you when you read instructions, pamphlets, or other written materials from your doctor or pharmacy?: 1 - Never  Interpreter Needed?: No  Information entered by :: R. Spirit Wernli LPN   Activities of Daily Living     10/17/2023    1:40 PM  In your present state of health, do you have any difficulty performing the following activities:  Hearing? 0  Vision? 0  Comment readers at times  Difficulty concentrating or making decisions? 0  Walking or climbing stairs? 0  Dressing or bathing? 0  Doing errands, shopping? 0  Preparing Food and eating ? N  Using the Toilet? N  In the past six months, have you accidently leaked urine? N  Do you have problems with loss of bowel control? N  Managing your Medications? N  Managing your Finances? N  Housekeeping or managing your Housekeeping? N    Patient Care Team: Thersia Flax, MD as PCP - General (Internal Medicine) Marquita Situ Magali Schmitz, MD (General Surgery) Thersia Flax, MD (Internal  Medicine)  I have updated your Care Teams any recent Medical Services you may have received from other providers in the past year.     Assessment:    This is a routine wellness examination for [facility].  Hearing/Vision screen Hearing Screening - Comments:: No issues Vision Screening - Comments:: Readers at times   Goals Addressed             This Visit's Progress    Patient Stated       Wants to lose 5 pounds       Depression Screen     10/17/2023    1:48 PM 02/20/2023    1:35 PM 01/03/2022    2:05 PM 11/24/2020    1:18 PM 11/19/2019    2:48 PM  PHQ 2/9 Scores  PHQ - 2 Score 0 0 0 0 0  PHQ- 9 Score 3    1    Fall Risk     10/17/2023    1:42 PM 02/20/2023    1:35 PM 01/03/2022    2:05 PM 12/14/2020    3:08 PM 11/24/2020    1:18 PM  Fall Risk   Falls in the past year? 1 0 0 0 0  Number falls in past yr: 0 0     Injury with Fall? 1 0     Comment broke her arm      Risk for fall due to : History of fall(s);Impaired balance/gait No Fall Risks No Fall Risks No Fall Risks No Fall Risks  Follow up Falls evaluation completed;Falls prevention discussed Falls evaluation completed Falls evaluation completed Falls evaluation completed Falls evaluation completed    MEDICARE RISK AT HOME:  Medicare Risk at Home Any stairs in or around the home?: Yes If so, are there any without handrails?: No Home free of loose throw rugs in walkways, pet beds, electrical cords, etc?: Yes Adequate lighting in your home to reduce risk of falls?: Yes Life alert?: No Use of a cane, walker or w/c?: No Grab bars in the bathroom?: No Shower chair or bench in shower?: No Elevated toilet seat or a handicapped toilet?: Yes  TIMED UP AND GO:  Was the test performed?  No  Cognitive Function: 6CIT completed        10/17/2023    1:53 PM  6CIT Screen  What Year? 0 points  What month? 0 points  What time? 0 points  Count back from 20 0 points  Months in reverse 0 points  Repeat phrase 0 points   Total Score 0 points    Immunizations Immunization History  Administered Date(s) Administered   Fluad Trivalent(High Dose 65+) 02/20/2023   Influenza,inj,Quad PF,6+ Mos 04/21/2014, 02/13/2019   Influenza-Unspecified 03/03/2020   Moderna SARS-COV2 Booster Vaccination 04/27/2020   Moderna Sars-Covid-2 Vaccination 08/02/2019, 08/30/2019   Tdap 04/21/2014   Zoster Recombinant(Shingrix) 12/17/2019, 03/03/2020    Screening Tests Health Maintenance  Topic Date Due   Medicare Annual Wellness (AWV)  Never done   Pneumonia Vaccine 56+ Years old (1 of 1 - PCV) Never done   COVID-19 Vaccine (3 - Moderna risk series) 05/25/2020   INFLUENZA VACCINE  12/15/2023   MAMMOGRAM  03/05/2024   DTaP/Tdap/Td (2 - Td or Tdap) 04/21/2024   Colonoscopy  05/28/2028   DEXA SCAN  Completed   Hepatitis C Screening  Completed   Zoster Vaccines- Shingrix  Completed   HPV VACCINES  Aged Out   Meningococcal B Vaccine  Aged Out    Health Maintenance  Health Maintenance Due  Topic Date Due   Medicare Annual Wellness (AWV)  Never done   Pneumonia Vaccine 29+ Years old (1 of 1 - PCV) Never done   COVID-19 Vaccine (3 - Moderna risk series) 05/25/2020   Health Maintenance Items Addressed: Discussed the need up update Pneumonia vaccine.  Additional Screening:  Vision Screening: Recommended annual ophthalmology exams for early detection of glaucoma and other disorders of the eye. Up to date  [facility] Would you like a referral to an eye doctor? No    Dental Screening: Recommended annual dental exams for proper oral hygiene  Community Resource Referral / Chronic Care Management: CRR required this visit?  No   CCM required this visit?  No   Plan:    I have personally reviewed and noted the following in the patient's chart:   Medical and social history Use of alcohol, tobacco or illicit drugs  Current medications and supplements including opioid prescriptions. Patient is not currently taking  opioid prescriptions. Functional ability and status Nutritional status Physical activity Advanced directives List of other physicians Hospitalizations, surgeries, and ER visits in previous 12 months Vitals Screenings to include cognitive, depression, and falls Referrals and appointments  In addition, I have reviewed and discussed with patient certain preventive protocols, quality metrics, and best practice recommendations. A written personalized care plan for preventive services as well as general preventive health recommendations were provided to patient.   Felicitas Horse, LPN   01/19/453   After Visit Summary: (MyChart) Due to this being a telephonic visit, the after visit summary with patients personalized plan was offered to patient via MyChart   Notes: Nothing significant to report at this time.

## 2023-10-17 NOTE — Patient Instructions (Signed)
 Ms. Erck , Thank you for taking time out of your busy schedule to complete your Annual Wellness Visit with me. I enjoyed our conversation and look forward to speaking with you again next year. I, as well as your care team,  appreciate your ongoing commitment to your health goals. Please review the following plan we discussed and let me know if I can assist you in the future. Your Game plan/ To Do List    Referrals: If you haven't heard from the office you've been referred to, please reach out to them at the phone provided.  Remember to update your pneumonia vaccine. Follow up Visits: Next Medicare AWV with our clinical staff: Patient declined to schedule.    Have you seen your provider in the last 6 months (3 months if uncontrolled diabetes)? No Next Office Visit with your provider: 02/27/24  Clinician Recommendations:  Aim for 30 minutes of exercise or brisk walking, 6-8 glasses of water, and 5 servings of fruits and vegetables each day.       This is a list of the screening recommended for you and due dates:  Health Maintenance  Topic Date Due   Pneumonia Vaccine (1 of 1 - PCV) Never done   COVID-19 Vaccine (3 - Moderna risk series) 05/25/2020   Flu Shot  12/15/2023   Mammogram  03/05/2024   DTaP/Tdap/Td vaccine (2 - Td or Tdap) 04/21/2024   Medicare Annual Wellness Visit  10/16/2024   Colon Cancer Screening  05/28/2028   DEXA scan (bone density measurement)  Completed   Hepatitis C Screening  Completed   Zoster (Shingles) Vaccine  Completed   HPV Vaccine  Aged Out   Meningitis B Vaccine  Aged Out    Advanced directives: (Copy Requested) Please bring a copy of your health care power of attorney and living will to the office to be added to your chart at your convenience. You can mail to [facility] [address] [place], [place] [address] or email to [email or web address] Advance Care Planning is important because it:  [x]  Makes sure you receive the medical  care that is consistent with your values, goals, and preferences  [x]  It provides guidance to your family and loved ones and reduces their decisional burden about whether or not they are making the right decisions based on your wishes.

## 2023-10-25 DIAGNOSIS — S53011D Anterior subluxation of right radial head, subsequent encounter: Secondary | ICD-10-CM | POA: Diagnosis not present

## 2023-10-25 DIAGNOSIS — S52124A Nondisplaced fracture of head of right radius, initial encounter for closed fracture: Secondary | ICD-10-CM | POA: Diagnosis not present

## 2023-12-13 DIAGNOSIS — S52124D Nondisplaced fracture of head of right radius, subsequent encounter for closed fracture with routine healing: Secondary | ICD-10-CM | POA: Diagnosis not present

## 2024-02-27 ENCOUNTER — Encounter: Payer: Self-pay | Admitting: Internal Medicine

## 2024-02-27 ENCOUNTER — Ambulatory Visit (INDEPENDENT_AMBULATORY_CARE_PROVIDER_SITE_OTHER): Payer: Medicare PPO | Admitting: Internal Medicine

## 2024-02-27 VITALS — BP 118/70 | HR 72 | Ht 65.0 in | Wt 137.0 lb

## 2024-02-27 DIAGNOSIS — R921 Mammographic calcification found on diagnostic imaging of breast: Secondary | ICD-10-CM

## 2024-02-27 DIAGNOSIS — R0683 Snoring: Secondary | ICD-10-CM | POA: Diagnosis not present

## 2024-02-27 DIAGNOSIS — E785 Hyperlipidemia, unspecified: Secondary | ICD-10-CM | POA: Diagnosis not present

## 2024-02-27 DIAGNOSIS — R5383 Other fatigue: Secondary | ICD-10-CM

## 2024-02-27 DIAGNOSIS — Z0000 Encounter for general adult medical examination without abnormal findings: Secondary | ICD-10-CM

## 2024-02-27 DIAGNOSIS — R7301 Impaired fasting glucose: Secondary | ICD-10-CM

## 2024-02-27 DIAGNOSIS — Z853 Personal history of malignant neoplasm of breast: Secondary | ICD-10-CM | POA: Diagnosis not present

## 2024-02-27 DIAGNOSIS — Z1231 Encounter for screening mammogram for malignant neoplasm of breast: Secondary | ICD-10-CM | POA: Diagnosis not present

## 2024-02-27 DIAGNOSIS — S42291S Other displaced fracture of upper end of right humerus, sequela: Secondary | ICD-10-CM

## 2024-02-27 DIAGNOSIS — Z23 Encounter for immunization: Secondary | ICD-10-CM

## 2024-02-27 DIAGNOSIS — N182 Chronic kidney disease, stage 2 (mild): Secondary | ICD-10-CM

## 2024-02-27 DIAGNOSIS — Z8639 Personal history of other endocrine, nutritional and metabolic disease: Secondary | ICD-10-CM

## 2024-02-27 DIAGNOSIS — N63 Unspecified lump in unspecified breast: Secondary | ICD-10-CM

## 2024-02-27 NOTE — Progress Notes (Unsigned)
 Patient ID: Karen Washington, female    DOB: 27-Mar-1956  Age: 68 y.o. MRN: [id number]  The patient is here for annual preventive  examination and management of other chronic and acute problems.   The risk factors are reflected in the social history.  The roster of all physicians providing medical care to patient - is listed in the Snapshot section of the chart.  Activities of daily living:  The patient is 100% independent in all ADLs: dressing, toileting, feeding as well as independent mobility  Home safety : The patient has smoke detectors in the home. They wear seatbelts.  There are no firearms at home. There is no violence in the home.   There is no risks for hepatitis, STDs or HIV. There is no   history of blood transfusion. They have no travel history to infectious disease endemic areas of the world.  The patient has seen their dentist in the last six month. They have seen their eye doctor in the last year. They admit to slight hearing difficulty with regard to whispered voices and some television programs.  They have deferred audiologic testing in the last year.  They do not  have excessive sun exposure. Discussed the need for sun protection: hats, long sleeves and use of sunscreen if there is significant sun exposure.   Diet: the importance of a healthy diet is discussed. They do have a healthy diet.  The benefits of regular aerobic exercise were discussed. She walks daily but has not been cleared to return to Yoga , pickle ball since her right radial head fracture in April   Depression screen: there are no signs or vegative symptoms of depression- irritability, change in appetite, anhedonia, sadness/tearfullness.  Cognitive assessment: the patient manages all their financial and personal affairs and is actively engaged. They could relate day,date,year and events; recalled 2/3 objects at 3 minutes; performed clock-face test normally.  The following portions of the patient's history were  reviewed and updated as appropriate: allergies, current medications, past family history, past medical history,  past surgical history, past social history  and problem list.  Visual acuity was not assessed per patient preference since she has regular follow up with her ophthalmologist. Hearing and body mass index were assessed and reviewed.   During the course of the visit the patient was educated and counseled about appropriate screening and preventive services including : fall prevention , diabetes screening, nutrition counseling, colorectal cancer screening, and recommended immunizations.    CC: The primary encounter diagnosis was Encounter for screening mammogram for malignant neoplasm of breast. Diagnoses of Impaired fasting glucose, Other fatigue, Hyperlipidemia, unspecified hyperlipidemia type, and History of breast cancer were also pertinent to this visit.  History Concha has a past medical history of Actinic keratosis, Breast cancer (HCC), Breast cancer of upper-inner quadrant of right female breast (HCC) (11/2012), Personal history of chemotherapy, Personal history of radiation therapy, and Shingles (2014).   She has a past surgical history that includes Breast surgery (Right, 12/13/2012); Portacath placement (2014); Mastopexy (Left, 03/2014); Port-a-cath removal (02/05/2014); Colonoscopy (2011); Breast biopsy (Right, 11/22/2012); Breast lumpectomy (Right, 12/13/2012); Augmentation mammaplasty (Left, 2014); and Breast biopsy (Right, 02/17/2021).   Her family history includes ADD / ADHD in her mother; Breast cancer in her maternal grandmother; Cancer in her father and mother; Depression in her mother; Miscarriages / Stillbirths in her mother; Vision loss in her mother.She reports that she has never smoked. She has never used smokeless tobacco. She reports current alcohol use. She reports  that she does not use drugs.  Outpatient Medications Prior to Visit  Medication Sig Dispense Refill    ALPRAZolam  (XANAX ) 0.25 MG tablet Take 1 tablet (0.25 mg total) by mouth at bedtime as needed. for sleep 30 tablet 0   cholecalciferol (VITAMIN D ) 1000 units tablet Take 1,000 Units by mouth daily.     naproxen sodium (ALEVE) 220 MG tablet Take 220 mg by mouth daily as needed.     No facility-administered medications prior to visit.    Review of Systems  Objective:  BP 118/70   Pulse 72   Ht 5' 5 (1.651 m)   Wt 137 lb (62.1 kg)   SpO2 96%   BMI 22.80 kg/m   Physical Exam  Assessment & Plan:  Encounter for screening mammogram for malignant neoplasm of breast  Impaired fasting glucose  Other fatigue  Hyperlipidemia, unspecified hyperlipidemia type  History of breast cancer      I provided 40 minutes of  face-to-face time during this encounter reviewing patient's current problems and past surgeries,  recent labs and imaging studies, providing counseling on the above mentioned problems , and coordination  of care .   Follow-up: No follow-ups on file.   Verneita LITTIE Kettering, MD

## 2024-02-27 NOTE — Patient Instructions (Addendum)
  I recommend getting the majority of your calcium and Vitamin D   through diet rather than supplements given the recent association of calcium supplements with increased coronary artery calcium scores (You need 1200 to 1800 mg of calcium daily )   Unsweetened almond/coconut milk and soy milk are low carb, cholesterol free  ways to increase your dietary calcium and vitamin D .   Vitamin D  intake should be 1000 to 2000 Ius  Daily unless otherwise instructed  (we are checking today)  Your  DEXA scan  been ordered.  Please  call to schedule your own  appointment at [facility]  , and their phone number is [phone]   Your annual mammogram has been ordered AND IS DUE.   [facility] will not allow us  to schedule it for you,  so please  call to make your appointment [phone]     You  will be due for your tetanus-diptheria-pertussis vaccine   (TDaP)   Please get this done at your pharmacy ;  it will be PAID FOR MY MEDICARE ONLY AT [facility]

## 2024-02-27 NOTE — Assessment & Plan Note (Signed)
 No recurrence since diagnosis in 2014.  Breast exam done and annual screening mammogram ,due in October along with  CA 27.29 ordered

## 2024-02-28 ENCOUNTER — Encounter: Payer: Self-pay | Admitting: Internal Medicine

## 2024-02-28 DIAGNOSIS — S42291S Other displaced fracture of upper end of right humerus, sequela: Secondary | ICD-10-CM | POA: Insufficient documentation

## 2024-02-28 LAB — CANCER ANTIGEN 27.29: CA 27.29: 18 U/mL (ref <38)

## 2024-02-28 NOTE — Assessment & Plan Note (Signed)
REASSURED PATIENT THAT SHE HAS NO SIGNS OF SYMPTOMS OF OSA

## 2024-02-28 NOTE — Assessment & Plan Note (Signed)
 She underwent biopsy of right breast in Oct 2022.  Pathology revealed BENIGN BREAST TISSUE WITH DENSE STROMAL FIBROSIS, CALCIFICATIONS ARE NOTED IN ASSOCIATION WITH BENIGN LOBULES AND FIBROSIS- NEGATIVE FOR ATYPIA / IN AND MALIGNANCY of the RIGHT.  Continue annual CA labs and 3d mammogram every October

## 2024-02-28 NOTE — Assessment & Plan Note (Signed)

## 2024-02-28 NOTE — Assessment & Plan Note (Signed)
 DEXA was normal in 2020; repeat is due given recent fracture.  Increased calcium and Vitamin D   supplementation reviewd and advised  given delay in healing

## 2024-02-28 NOTE — Addendum Note (Signed)
 Addended by: MARYLEN PRO A on: 02/28/2024 10:07 AM   Modules accepted: Orders

## 2024-03-01 ENCOUNTER — Ambulatory Visit: Payer: Self-pay | Admitting: Internal Medicine

## 2024-03-01 DIAGNOSIS — R944 Abnormal results of kidney function studies: Secondary | ICD-10-CM

## 2024-03-04 ENCOUNTER — Other Ambulatory Visit

## 2024-03-04 ENCOUNTER — Other Ambulatory Visit (INDEPENDENT_AMBULATORY_CARE_PROVIDER_SITE_OTHER)

## 2024-03-04 DIAGNOSIS — E785 Hyperlipidemia, unspecified: Secondary | ICD-10-CM

## 2024-03-04 DIAGNOSIS — R7301 Impaired fasting glucose: Secondary | ICD-10-CM | POA: Diagnosis not present

## 2024-03-04 DIAGNOSIS — Z8639 Personal history of other endocrine, nutritional and metabolic disease: Secondary | ICD-10-CM

## 2024-03-04 DIAGNOSIS — R5383 Other fatigue: Secondary | ICD-10-CM | POA: Diagnosis not present

## 2024-03-04 LAB — COMPREHENSIVE METABOLIC PANEL WITH GFR
ALT: 14 U/L (ref 0–35)
AST: 15 U/L (ref 0–37)
Albumin: 4.6 g/dL (ref 3.5–5.2)
Alkaline Phosphatase: 57 U/L (ref 39–117)
BUN: 20 mg/dL (ref 6–23)
CO2: 32 meq/L (ref 19–32)
Calcium: 9.5 mg/dL (ref 8.4–10.5)
Chloride: 101 meq/L (ref 96–112)
Creatinine, Ser: 1.02 mg/dL (ref 0.40–1.20)
GFR: 57.37 mL/min — ABNORMAL LOW (ref >60.00)
Glucose, Bld: 98 mg/dL (ref 70–99)
Potassium: 4.6 meq/L (ref 3.5–5.1)
Sodium: 141 meq/L (ref 135–145)
Total Bilirubin: 0.5 mg/dL (ref 0.2–1.2)
Total Protein: 6.6 g/dL (ref 6.0–8.3)

## 2024-03-04 LAB — CBC WITH DIFFERENTIAL/PLATELET
Basophils Absolute: 0.1 K/uL (ref 0.0–0.1)
Basophils Relative: 1.2 % (ref 0.0–3.0)
Eosinophils Absolute: 0.1 K/uL (ref 0.0–0.7)
Eosinophils Relative: 1.3 % (ref 0.0–5.0)
HCT: 43 % (ref 36.0–46.0)
Hemoglobin: 14 g/dL (ref 12.0–15.0)
Lymphocytes Relative: 21.8 % (ref 12.0–46.0)
Lymphs Abs: 1.1 K/uL (ref 0.7–4.0)
MCHC: 32.6 g/dL (ref 30.0–36.0)
MCV: 92.6 fl (ref 78.0–100.0)
Monocytes Absolute: 0.4 K/uL (ref 0.1–1.0)
Monocytes Relative: 8.2 % (ref 3.0–12.0)
Neutro Abs: 3.3 K/uL (ref 1.4–7.7)
Neutrophils Relative %: 67.5 % (ref 43.0–77.0)
Platelets: 258 K/uL (ref 150.0–400.0)
RBC: 4.64 Mil/uL (ref 3.87–5.11)
RDW: 13 % (ref 11.5–15.5)
WBC: 4.9 K/uL (ref 4.0–10.5)

## 2024-03-04 LAB — VITAMIN D 25 HYDROXY (VIT D DEFICIENCY, FRACTURES): VITD: 30.65 ng/mL (ref 30.00–100.00)

## 2024-03-04 LAB — LIPID PANEL
Cholesterol: 204 mg/dL — ABNORMAL HIGH (ref 0–200)
HDL: 79.4 mg/dL (ref >39.00)
LDL Cholesterol: 112 mg/dL — ABNORMAL HIGH (ref 0–99)
NonHDL: 124.8
Total CHOL/HDL Ratio: 3
Triglycerides: 64 mg/dL (ref 0.0–149.0)
VLDL: 12.8 mg/dL (ref 0.0–40.0)

## 2024-03-04 LAB — HEMOGLOBIN A1C: Hgb A1c MFr Bld: 6 % (ref 4.6–6.5)

## 2024-03-04 LAB — LDL CHOLESTEROL, DIRECT: Direct LDL: 104 mg/dL

## 2024-03-04 LAB — TSH: TSH: 1.35 u[IU]/mL (ref 0.35–5.50)

## 2024-03-05 DIAGNOSIS — N182 Chronic kidney disease, stage 2 (mild): Secondary | ICD-10-CM | POA: Insufficient documentation

## 2024-03-05 NOTE — Addendum Note (Signed)
 Addended by: MARYLYNN VERNEITA CROME on: 03/05/2024 12:14 PM   Modules accepted: Orders

## 2024-03-21 ENCOUNTER — Other Ambulatory Visit (INDEPENDENT_AMBULATORY_CARE_PROVIDER_SITE_OTHER)

## 2024-03-21 ENCOUNTER — Other Ambulatory Visit

## 2024-03-21 DIAGNOSIS — N182 Chronic kidney disease, stage 2 (mild): Secondary | ICD-10-CM | POA: Diagnosis not present

## 2024-03-21 LAB — BASIC METABOLIC PANEL WITH GFR
BUN: 16 mg/dL (ref 6–23)
CO2: 31 meq/L (ref 19–32)
Calcium: 9.2 mg/dL (ref 8.4–10.5)
Chloride: 101 meq/L (ref 96–112)
Creatinine, Ser: 0.99 mg/dL (ref 0.40–1.20)
GFR: 59.45 mL/min — ABNORMAL LOW
Glucose, Bld: 85 mg/dL (ref 70–99)
Potassium: 4.5 meq/L (ref 3.5–5.1)
Sodium: 140 meq/L (ref 135–145)

## 2024-04-02 ENCOUNTER — Ambulatory Visit
Admission: RE | Admit: 2024-04-02 | Discharge: 2024-04-02 | Disposition: A | Source: Ambulatory Visit | Attending: Internal Medicine | Admitting: Internal Medicine

## 2024-04-02 ENCOUNTER — Ambulatory Visit
Admission: RE | Admit: 2024-04-02 | Discharge: 2024-04-02 | Disposition: A | Discharge status: Home / Self Care | Source: Ambulatory Visit | Attending: Internal Medicine | Admitting: Internal Medicine

## 2024-04-02 DIAGNOSIS — S42291S Other displaced fracture of upper end of right humerus, sequela: Secondary | ICD-10-CM | POA: Insufficient documentation

## 2024-04-02 DIAGNOSIS — Z780 Asymptomatic menopausal state: Secondary | ICD-10-CM | POA: Diagnosis not present

## 2024-04-02 DIAGNOSIS — M8589 Other specified disorders of bone density and structure, multiple sites: Secondary | ICD-10-CM | POA: Insufficient documentation

## 2024-04-02 DIAGNOSIS — X58XXXS Exposure to other specified factors, sequela: Secondary | ICD-10-CM | POA: Diagnosis not present

## 2024-04-02 DIAGNOSIS — Z1231 Encounter for screening mammogram for malignant neoplasm of breast: Secondary | ICD-10-CM | POA: Insufficient documentation

## 2024-04-18 ENCOUNTER — Ambulatory Visit: Admitting: Dermatology

## 2024-04-23 ENCOUNTER — Ambulatory Visit

## 2024-04-23 DIAGNOSIS — D485 Neoplasm of uncertain behavior of skin: Secondary | ICD-10-CM

## 2024-04-23 DIAGNOSIS — L820 Inflamed seborrheic keratosis: Secondary | ICD-10-CM

## 2024-04-23 NOTE — Patient Instructions (Addendum)

## 2024-04-23 NOTE — Progress Notes (Signed)
 Subjective   Karen Washington is a 68 y.o. female who presents for the following: Lesion(s) of concern . Patient is established patient   Today patient reports: Raised lesions on the shoulders that pt is concerned.  Review of Systems:    No other skin or systemic complaints except as noted in HPI or Assessment and Plan.  The following portions of the chart were reviewed this encounter and updated as appropriate: medications, allergies, medical history  Relevant Medical History:  Personal history of actinic keratosis   Objective  (SKPE) Well appearing patient in no apparent distress; mood and affect are within normal limits. Examination was performed of the: Focused Exam of: face, chest, back   Examination notable for: Lentigo/lentigines: Scattered pigmented macules that are tan to brown in color and are somewhat non-uniform in shape and concentrated in the sun-exposed areas, Nevus/nevi: Scattered well-demarcated, regular, pigmented macule(s) and/or papule(s)  , Seborrheic Keratosis(es): Stuck-on appearing keratotic papule(s) on the trunk, some  irritated with redness, crusting, edema, and/or partial avulsion, Actinic Damage/Elastosis: chronic sun damage: dyspigmentation, telangiectasia, and wrinkling  Examination limited by: n/a   L clavicle 0.7 cm irregular brown macule  back x 3 (3) Stuck-on, waxy, tan-brown papule--Discussed benign etiology and prognosis.   Assessment & Plan  (SKAP)  BENIGN SKIN FINDINGS  - Lentigines  - Seborrheic keratoses - Reassurance provided regarding the benign appearance of lesions noted on exam today; no treatment is indicated in the absence of symptoms/changes. - Reinforced importance of photoprotective strategies including liberal and frequent sunscreen use of a broad-spectrum SPF 30 or greater, use of protective clothing, and sun avoidance for prevention of cutaneous malignancy and photoaging.  Counseled patient on the importance of regular self-skin  monitoring as well as routine clinical skin examinations as scheduled.   ACTINIC DAMAGE - Chronic condition, secondary to cumulative UV/sun exposure - Recommend daily broad spectrum sunscreen SPF 30+ to sun-exposed areas, reapply every 2 hours as needed.  - Staying in the shade or wearing long sleeves, sun glasses (UVA+UVB protection) and wide brim hats (4-inch brim around the entire circumference of the hat) are also recommended for sun protection.  - Call for new or changing lesions.  Personal history of actinic keratosis  - Reviewed medical history for full details  - Reviewed sun protective measures as above - Encouraged full body skin exams    Procedures, orders, diagnosis for this visit:  NEOPLASM OF UNCERTAIN BEHAVIOR OF SKIN L clavicle Skin / nail biopsy Type of biopsy: tangential   Informed consent: discussed and consent obtained   Timeout: patient name, date of birth, surgical site, and procedure verified   Procedure prep:  Patient was prepped and draped in usual sterile fashion Prep type:  Isopropyl alcohol Anesthesia: the lesion was anesthetized in a standard fashion   Anesthetic:  1% lidocaine w/ epinephrine 1-100,000 buffered w/ 8.4% NaHCO3 Instrument used: DermaBlade   Hemostasis achieved with: pressure and aluminum chloride   Outcome: patient tolerated procedure well   Post-procedure details: sterile dressing applied and wound care instructions given   Dressing type: bandage and petrolatum    Specimen 1 - Surgical pathology Differential Diagnosis: D48.5 SK vs lentigo vs MM Check Margins: No INFLAMED SEBORRHEIC KERATOSIS (3) back x 3 (3) Symptomatic, irritating, patient would like treated.  Destruction of lesion - back x 3 (3) Complexity: simple   Destruction method: cryotherapy   Informed consent: discussed and consent obtained   Timeout:  patient name, date of birth, surgical site, and  procedure verified Lesion destroyed using liquid nitrogen: Yes   Region  frozen until ice ball extended beyond lesion: Yes   Cryo cycles: 1 or 2. Outcome: patient tolerated procedure well with no complications   Post-procedure details: wound care instructions given     Neoplasm of uncertain behavior of skin -     Skin / nail biopsy -     Surgical pathology; Standing  Inflamed seborrheic keratosis -     Destruction of lesion   Return to clinic: Return for appointment as scheduled.  LILLETTE Rosina Mayans, CMA, am acting as scribe for Lauraine JAYSON Kanaris, MD .   Documentation: I have reviewed the above documentation for accuracy and completeness, and I agree with the above.  Lauraine JAYSON Kanaris, MD

## 2024-04-29 ENCOUNTER — Ambulatory Visit: Payer: Self-pay

## 2024-04-29 LAB — SURGICAL PATHOLOGY

## 2024-04-29 NOTE — Progress Notes (Signed)
Patient informed and voiced good understanding.

## 2024-09-18 ENCOUNTER — Ambulatory Visit: Admitting: Dermatology

## 2025-02-27 ENCOUNTER — Encounter: Admitting: Internal Medicine
